# Patient Record
Sex: Female | Born: 1940 | Race: White | Hispanic: No | Marital: Married | Smoking: Former smoker
Health system: Southern US, Community
[De-identification: ages and names within clinical notes are randomized; demographics above are authoritative.]

## PROBLEM LIST (undated history)

## (undated) DIAGNOSIS — E119 Type 2 diabetes mellitus without complications: Secondary | ICD-10-CM

## (undated) DIAGNOSIS — G4730 Sleep apnea, unspecified: Secondary | ICD-10-CM

## (undated) DIAGNOSIS — K219 Gastro-esophageal reflux disease without esophagitis: Secondary | ICD-10-CM

## (undated) DIAGNOSIS — I10 Essential (primary) hypertension: Secondary | ICD-10-CM

## (undated) DIAGNOSIS — M1990 Unspecified osteoarthritis, unspecified site: Secondary | ICD-10-CM

## (undated) DIAGNOSIS — G709 Myoneural disorder, unspecified: Secondary | ICD-10-CM

## (undated) DIAGNOSIS — J449 Chronic obstructive pulmonary disease, unspecified: Secondary | ICD-10-CM

## (undated) DIAGNOSIS — F329 Major depressive disorder, single episode, unspecified: Secondary | ICD-10-CM

## (undated) DIAGNOSIS — F32A Depression, unspecified: Secondary | ICD-10-CM

## (undated) DIAGNOSIS — C801 Malignant (primary) neoplasm, unspecified: Secondary | ICD-10-CM

## (undated) DIAGNOSIS — J45909 Unspecified asthma, uncomplicated: Secondary | ICD-10-CM

## (undated) DIAGNOSIS — E039 Hypothyroidism, unspecified: Secondary | ICD-10-CM

## (undated) DIAGNOSIS — R0602 Shortness of breath: Secondary | ICD-10-CM

## (undated) HISTORY — PX: CHOLECYSTECTOMY: SHX55

## (undated) HISTORY — PX: COLON SURGERY: SHX602

## (undated) HISTORY — PX: TONSILLECTOMY: SUR1361

## (undated) HISTORY — PX: APPENDECTOMY: SHX54

## (undated) HISTORY — PX: BACK SURGERY: SHX140

## (undated) HISTORY — PX: CERVICAL SPINE SURGERY: SHX589

## (undated) HISTORY — PX: ABDOMINAL HYSTERECTOMY: SHX81

## (undated) HISTORY — PX: BREAST SURGERY: SHX581

## (undated) HISTORY — PX: MELANOMA EXCISION: SHX5266

## (undated) HISTORY — PX: THYROIDECTOMY: SHX17

---

## 2010-10-02 ENCOUNTER — Ambulatory Visit (HOSPITAL_COMMUNITY)
Admission: RE | Admit: 2010-10-02 | Discharge: 2010-10-02 | Payer: Self-pay | Source: Home / Self Care | Admitting: Internal Medicine

## 2010-10-12 ENCOUNTER — Ambulatory Visit (HOSPITAL_COMMUNITY)
Admission: RE | Admit: 2010-10-12 | Discharge: 2010-10-12 | Payer: Self-pay | Source: Home / Self Care | Attending: Internal Medicine | Admitting: Internal Medicine

## 2011-04-03 ENCOUNTER — Encounter (HOSPITAL_COMMUNITY)
Admission: RE | Admit: 2011-04-03 | Discharge: 2011-04-03 | Disposition: A | Discharge status: Home / Self Care | Payer: Medicare Other | Source: Ambulatory Visit | Attending: Neurosurgery | Admitting: Neurosurgery

## 2011-04-03 ENCOUNTER — Ambulatory Visit (HOSPITAL_COMMUNITY)
Admission: RE | Admit: 2011-04-03 | Discharge: 2011-04-03 | Disposition: A | Discharge status: Home / Self Care | Payer: Medicare Other | Source: Ambulatory Visit | Attending: Neurosurgery | Admitting: Neurosurgery

## 2011-04-03 ENCOUNTER — Other Ambulatory Visit (HOSPITAL_COMMUNITY): Payer: Self-pay | Admitting: Neurosurgery

## 2011-04-03 DIAGNOSIS — M4802 Spinal stenosis, cervical region: Secondary | ICD-10-CM

## 2011-04-03 LAB — BASIC METABOLIC PANEL
BUN: 21 mg/dL (ref 6–23)
Calcium: 9.3 mg/dL (ref 8.4–10.5)
Creatinine, Ser: 1.12 mg/dL (ref 0.4–1.2)
GFR calc Af Amer: 58 mL/min — ABNORMAL LOW (ref >60)
GFR calc non Af Amer: 48 mL/min — ABNORMAL LOW (ref >60)
Potassium: 4.8 mEq/L (ref 3.5–5.1)
Sodium: 142 mEq/L (ref 135–145)

## 2011-04-03 LAB — CBC
MCH: 30.2 pg (ref 26.0–34.0)
RBC: 4.2 MIL/uL (ref 3.87–5.11)

## 2011-04-03 LAB — ABO/RH: ABO/RH(D): O POS

## 2011-04-03 LAB — DIFFERENTIAL
Basophils Relative: 1 % (ref 0–1)
Eosinophils Absolute: 0.1 10*3/uL (ref 0.0–0.7)
Eosinophils Relative: 1 % (ref 0–5)
Lymphocytes Relative: 26 % (ref 12–46)
Monocytes Absolute: 0.6 10*3/uL (ref 0.1–1.0)
Neutro Abs: 4.4 10*3/uL (ref 1.7–7.7)
Neutrophils Relative %: 63 % (ref 43–77)

## 2011-04-03 LAB — TYPE AND SCREEN
ABO/RH(D): O POS
Antibody Screen: NEGATIVE

## 2011-04-03 LAB — SURGICAL PCR SCREEN
MRSA, PCR: NEGATIVE
Staphylococcus aureus: NEGATIVE

## 2011-04-04 ENCOUNTER — Ambulatory Visit (HOSPITAL_COMMUNITY): Payer: Medicare Other

## 2011-04-04 ENCOUNTER — Inpatient Hospital Stay (HOSPITAL_COMMUNITY)
Admission: RE | Admit: 2011-04-04 | Discharge: 2011-04-05 | DRG: 473 | Disposition: A | Discharge status: Home / Self Care | Payer: Medicare Other | Source: Ambulatory Visit | Attending: Neurosurgery | Admitting: Neurosurgery

## 2011-04-04 DIAGNOSIS — Z79899 Other long term (current) drug therapy: Secondary | ICD-10-CM

## 2011-04-04 DIAGNOSIS — E119 Type 2 diabetes mellitus without complications: Secondary | ICD-10-CM | POA: Diagnosis present

## 2011-04-04 DIAGNOSIS — J45909 Unspecified asthma, uncomplicated: Secondary | ICD-10-CM | POA: Diagnosis present

## 2011-04-04 DIAGNOSIS — F329 Major depressive disorder, single episode, unspecified: Secondary | ICD-10-CM | POA: Diagnosis present

## 2011-04-04 DIAGNOSIS — F3289 Other specified depressive episodes: Secondary | ICD-10-CM | POA: Diagnosis present

## 2011-04-04 DIAGNOSIS — Z7982 Long term (current) use of aspirin: Secondary | ICD-10-CM

## 2011-04-04 DIAGNOSIS — G4733 Obstructive sleep apnea (adult) (pediatric): Secondary | ICD-10-CM | POA: Diagnosis present

## 2011-04-04 DIAGNOSIS — I10 Essential (primary) hypertension: Secondary | ICD-10-CM | POA: Diagnosis present

## 2011-04-04 DIAGNOSIS — Z87891 Personal history of nicotine dependence: Secondary | ICD-10-CM

## 2011-04-04 DIAGNOSIS — K219 Gastro-esophageal reflux disease without esophagitis: Secondary | ICD-10-CM | POA: Diagnosis present

## 2011-04-04 DIAGNOSIS — M4712 Other spondylosis with myelopathy, cervical region: Principal | ICD-10-CM | POA: Diagnosis present

## 2011-04-04 DIAGNOSIS — Z794 Long term (current) use of insulin: Secondary | ICD-10-CM

## 2011-04-04 LAB — GLUCOSE, CAPILLARY
Glucose-Capillary: 161 mg/dL — ABNORMAL HIGH (ref 70–99)
Glucose-Capillary: 263 mg/dL — ABNORMAL HIGH (ref 70–99)

## 2011-04-05 LAB — GLUCOSE, CAPILLARY
Glucose-Capillary: 298 mg/dL — ABNORMAL HIGH (ref 70–99)
Glucose-Capillary: 261 mg/dL — ABNORMAL HIGH (ref 70–99)

## 2011-04-12 ENCOUNTER — Ambulatory Visit
Admission: RE | Admit: 2011-04-12 | Discharge: 2011-04-12 | Disposition: A | Discharge status: Home / Self Care | Payer: Medicare Other | Source: Ambulatory Visit | Attending: Neurosurgery | Admitting: Neurosurgery

## 2011-04-12 ENCOUNTER — Other Ambulatory Visit: Payer: Self-pay | Admitting: Neurosurgery

## 2011-04-12 DIAGNOSIS — M542 Cervicalgia: Secondary | ICD-10-CM

## 2011-04-27 NOTE — Op Note (Signed)
NAMEBERENIS, Lindsey West               ACCOUNT NO.:  [id number]  MEDICAL RECORD NO.:  [id number]  LOCATION:  3526                         FACILITY:  [facility]  PHYSICIAN:  Lindsey Maser. Deasia Chiu, M.D.    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  PROCEDURE NAME:  C5-6 and C6-7 anterior cervical diskectomy and fusion with allograft and plating.  SURGEON:  Lindsey Maser. Paelyn Smick, MD  ASSISTANT:  Donalee Citrin, MD  ANESTHESIA:  General endotracheal.  PREMEDICATION:  Lindsey West is a 70 year old female with history of neck pain and bilateral upper and lower extremity numbness, paresthesias, pain, and weakness consistent with cervical myelopathy.  Workup demonstrates evidence of marked cervical stenosis at C5-6 and C6-7.  The patient presents now for 2-level anterior cervical decompression and fusion in the hopes of improving her symptoms.  OPERATIVE NOTE:  The patient was brought to the operating room and placed on the operating table in supine position.  After adequate level of anesthesia was achieved, the patient was positioned supine with the neck slightly extended and held in place with halter traction.  The patient's anterior cervical region was prepped and draped sterilely.  A #10 blade was used to make a curvilinear skin incision overlying the C6 vertebral level.  This was carried down sharply to the platysma.  The platysma was divided vertically and dissection proceeded along the medial border of the sternocleidomastoid muscle and carotid sheath. Trachea and esophagus were mobilized and tracked towards the left. Prevertebral fascia was stripped off the anterior spinal column.  Longus colli was then elevated bilaterally using electrocautery.  Deep self- retaining retractor was placed.   Intraoperative fluoroscopy was used and the levels were confirmed.  Disk spaces of C5-6 and C6-7 were incised with a #15 blade in a rectangular fashion.  A wide disk space clean out was then achieved using pituitary rongeurs, forward and backward Karlin curettes, Kerrison rongeurs, and high-speed drill.  All elements of disks were removed down to the level of the posterior annulus. Microscope was then brought to the field and used throughout the remainder of the diskectomy and remaining aspects of the annulus. Osteophytes were removed using high-speed drill down to the level of the posterior longitudinal ligament.  The posterior longitudinal ligament was elevated and resected in a piecemeal fashion using Kerrison rongeurs.  Underlying thecal sac was then identified.  Wide central decompression was then performed undercutting the bodies of C5-C6.  Decompression was then proceeded out each neural foramen.  Wide anterior foraminotomies were then performed along the course of the exiting C6 nerve roots bilaterally.  At this point, a very thorough decompression was achieved. There was no evidence of injury to thecal sac or nerve roots.  Procedure was then repeated at C6-7 again without complication.  Wound was then irrigated with antibiotic solution.  Gelfoam was placed topically for hemostasis as needed.  Cornerstone allograft wedge was then packed into place and recessed approximately 1 mm for the anterior margin at C5-6 and  C6-7.  A 42-mm Atlantis anterior cervical plate was then placed over the C5, C6, and C7 levels.  This was then attached under fluoroscopic guidance using 13-mm variable angle screws, two each at all three levels.  All six screws were given final tightening and found to be solidly within bone.  Locking screws were engaged at all three levels. Final images revealed good position of the bone graft, hardware at proper operative level, and normal alignment of spine.  Wound  was then irrigated with antibiotic solution.  Hemostasis was ensured with bipolar electrocautery.  Wound was then closed in layers.  Steri-Strips and sterile dressing were applied.  There were no complications.  The patient tolerated the procedure well and she returned to the recovery room postoperatively.          ______________________________ Lindsey West, M.D.     HAP/MEDQ  D:  04/04/2011  T:  04/05/2011  Job:  [id number]  Electronically Signed by Julio Sicks M.D. on 04/27/2011 08:00:47 AM

## 2011-05-14 ENCOUNTER — Other Ambulatory Visit: Payer: Self-pay | Admitting: Neurosurgery

## 2011-05-14 DIAGNOSIS — M5124 Other intervertebral disc displacement, thoracic region: Secondary | ICD-10-CM

## 2011-05-17 ENCOUNTER — Ambulatory Visit
Admission: RE | Admit: 2011-05-17 | Discharge: 2011-05-17 | Disposition: A | Discharge status: Home / Self Care | Payer: Medicare Other | Source: Ambulatory Visit | Attending: Neurosurgery | Admitting: Neurosurgery

## 2011-05-17 DIAGNOSIS — M5124 Other intervertebral disc displacement, thoracic region: Secondary | ICD-10-CM

## 2011-05-17 DIAGNOSIS — M549 Dorsalgia, unspecified: Secondary | ICD-10-CM

## 2011-05-17 MED ORDER — IOHEXOL 300 MG/ML  SOLN
10.0000 mL | Freq: Once | INTRAMUSCULAR | Status: AC | PRN
  Administered 2011-05-17: 10 mL via INTRATHECAL

## 2011-05-17 MED ORDER — DIAZEPAM 2 MG PO TABS: 5.0000 mg | ORAL_TABLET | Freq: Once | ORAL | Status: DC

## 2011-05-17 NOTE — Progress Notes (Signed)
Pt returned from myelo doing well, denies discomfort at present.

## 2011-06-13 ENCOUNTER — Encounter (HOSPITAL_COMMUNITY)
Admission: RE | Admit: 2011-06-13 | Discharge: 2011-06-13 | Disposition: A | Discharge status: Home / Self Care | Payer: Medicare Other | Source: Ambulatory Visit | Attending: Neurosurgery | Admitting: Neurosurgery

## 2011-06-13 DIAGNOSIS — Z01812 Encounter for preprocedural laboratory examination: Secondary | ICD-10-CM | POA: Insufficient documentation

## 2011-06-13 DIAGNOSIS — Z01818 Encounter for other preprocedural examination: Secondary | ICD-10-CM | POA: Insufficient documentation

## 2011-06-13 LAB — CBC
MCV: 90.5 fL (ref 78.0–100.0)
Platelets: 175 10*3/uL (ref 150–400)
RBC: 4.21 MIL/uL (ref 3.87–5.11)
WBC: 5.5 10*3/uL (ref 4.0–10.5)

## 2011-06-13 LAB — BASIC METABOLIC PANEL
CO2: 31 mEq/L (ref 19–32)
Calcium: 9.4 mg/dL (ref 8.4–10.5)
GFR calc non Af Amer: 48 mL/min — ABNORMAL LOW (ref >60)
Sodium: 141 mEq/L (ref 135–145)

## 2011-06-13 LAB — DIFFERENTIAL
Eosinophils Absolute: 0.1 10*3/uL (ref 0.0–0.7)
Lymphs Abs: 1.9 10*3/uL (ref 0.7–4.0)
Neutro Abs: 3 10*3/uL (ref 1.7–7.7)
Neutrophils Relative %: 54 % (ref 43–77)

## 2011-06-13 LAB — TYPE AND SCREEN: ABO/RH(D): O POS

## 2011-06-13 LAB — SURGICAL PCR SCREEN
MRSA, PCR: NEGATIVE
Staphylococcus aureus: NEGATIVE

## 2011-06-26 ENCOUNTER — Inpatient Hospital Stay (HOSPITAL_COMMUNITY): Admission: RE | Admit: 2011-06-26 | Payer: Medicare Other | Source: Ambulatory Visit | Admitting: Neurosurgery

## 2011-09-15 IMAGING — RF DG MYELOGRAM THORACIC
10 series · 10 of 10 positions shown · non-contrast
Comparison: none

CLINICAL DATA: Prior cervical fusion. Thoracic spondylosis.  Mid
back pain.
TECHNIQUE: Contiguous axial images were obtained through the
Cervical spine without infusion. Coronal and sagittal
reconstructions were obtained of the axial image sets.

[Series 1: (hospital) · 1 of 1 slices shown (1 of 2)]
[im 1/1]
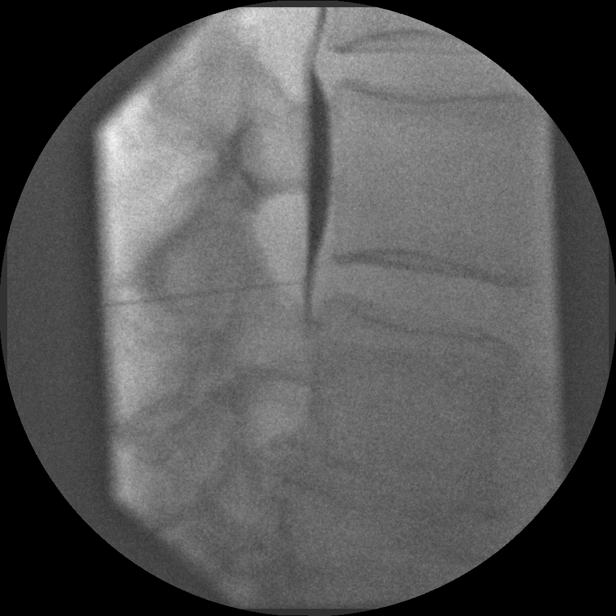

[Series 2: (hospital) · 1 of 1 slices shown (2 of 2)]
[im 1/1]
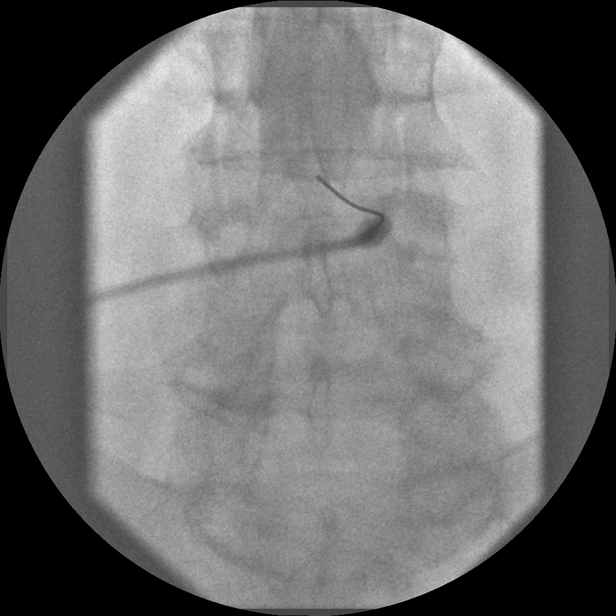

[Series 3: myelogram  white · 1 of 1 slices shown (1 of 8)]
[im 1/1]
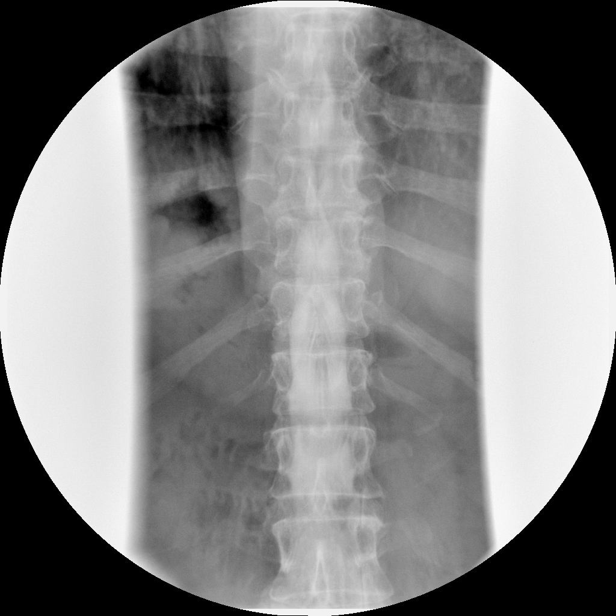

[Series 4: myelogram  white · 1 of 1 slices shown (2 of 8)]
[im 1/1]
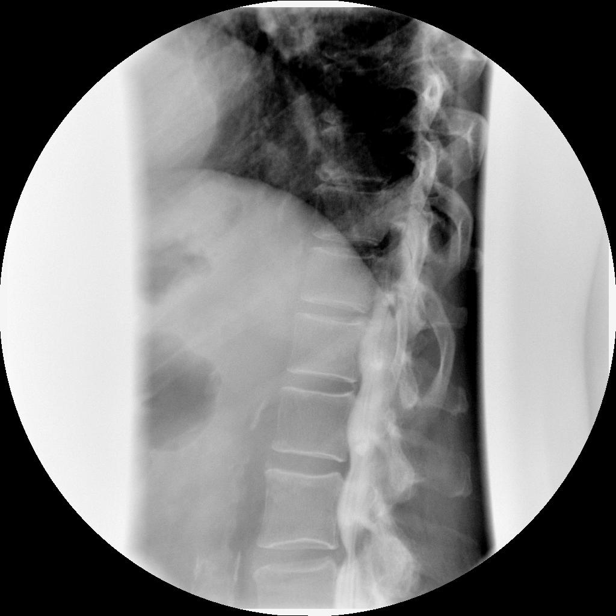

[Series 5: myelogram  white · 1 of 1 slices shown (3 of 8)]
[im 1/1]
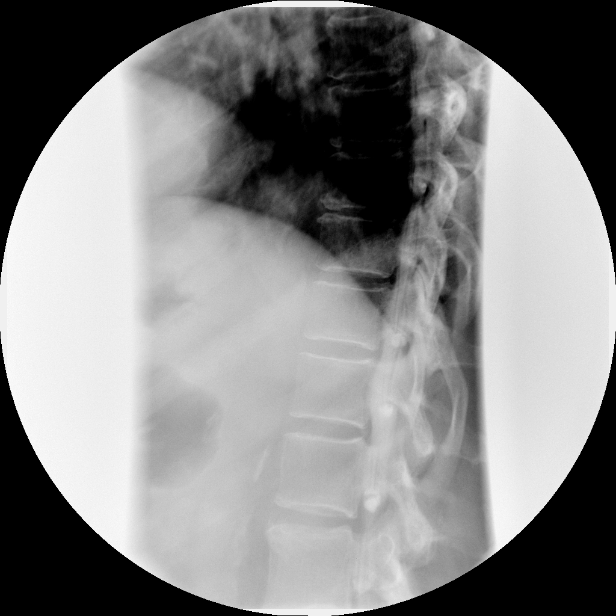

[Series 6: myelogram  white · 1 of 1 slices shown (4 of 8)]
[im 1/1]
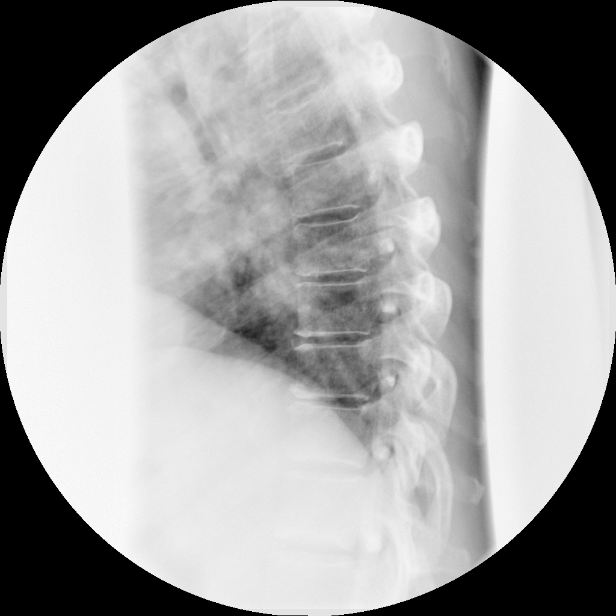

[Series 7: myelogram  white · 1 of 1 slices shown (5 of 8)]
[im 1/1]
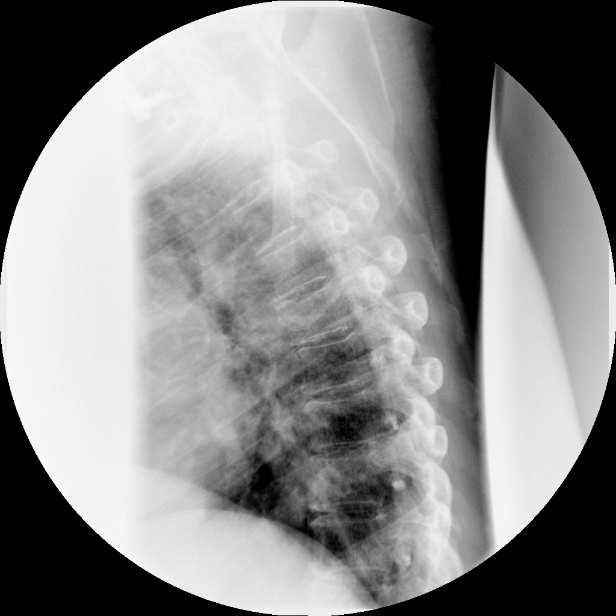

[Series 8: myelogram  white · 1 of 1 slices shown (6 of 8)]
[im 1/1]
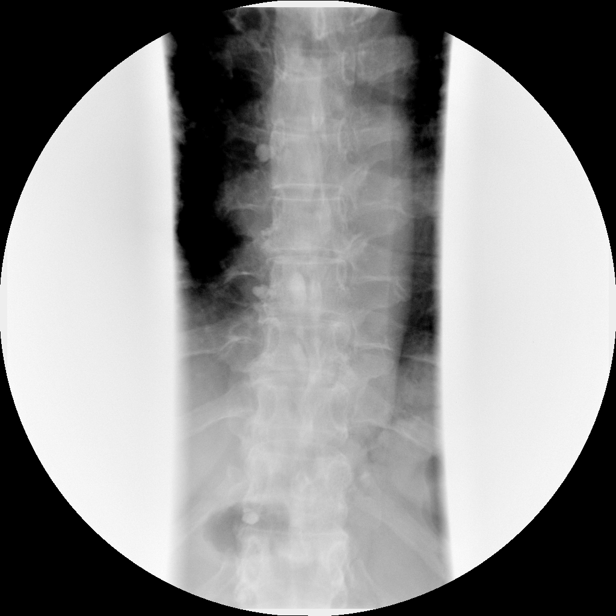

[Series 9: myelogram  white · 1 of 1 slices shown (7 of 8)]
[im 1/1]
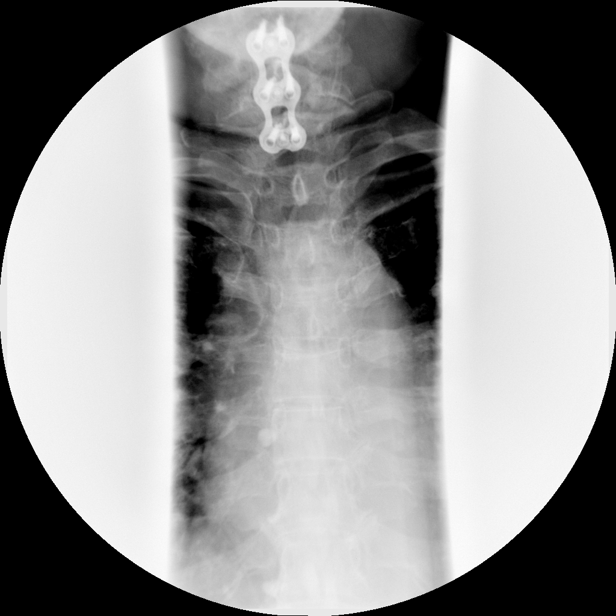

[Series 10: myelogram  white · 1 of 1 slices shown (8 of 8)]
[im 1/1]
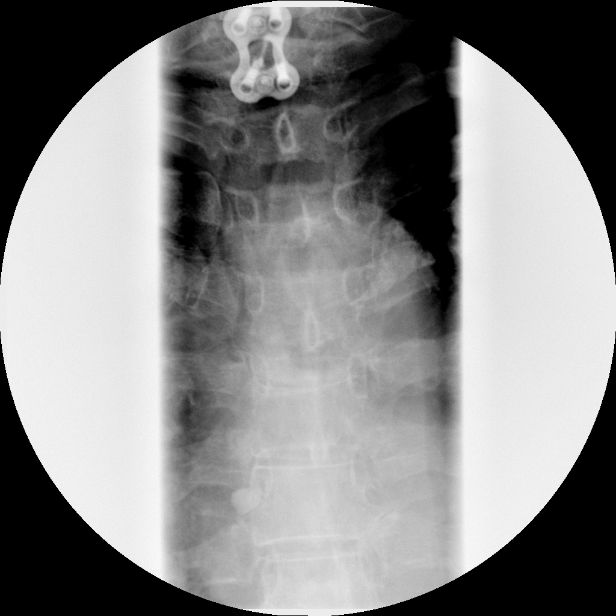

[10 of 10 positions shown; findings below may reference images not displayed]

LUMBAR PUNCTURE FOR THORACIC MYELOGRAM

 Procedure: After thorough discussion of risks and benefits of the
procedure including bleeding, infection, injury to nerves, blood
vessels, adjacent structures as well as headache and CSF leak,
written and oral informed consent was obtained.   Consent was
obtained by Dr.Christian Damian.

Patient was positioned prone on the fluoroscopy table. Local
anesthesia was provided with 1% lidocaine without epinephrine after
prepped and draped in the usual sterile fashion. Puncture was
performed at L3-L4 using a five inch 22-gauge spinal needle via
right paramedian approach.  Using a single pass through the dura,
the needle was placed within the thecal sac, with return of clear
CSF. 8 mL of Omnipaque-O77 was injected into the thecal sac, with
normal opacification of the nerve roots and cauda equina consistent
with free flow within the subarachnoid space.

Fluoroscopy time: 1 minute 31 seconds
FINDINGS: Spinal cord appears to terminate about T12.  Suboptimal
visualization of the thoracic spine on plain film imaging.  Lower
cervical ACDF visualized.
IMPRESSION: 1.  Technically successful lumbar puncture for thoracic myelogram.
2.  Suboptimal visualization of the thoracic spine by plain film.
See below.

CT THORACIC MYELOGRAM
FINDINGS: Incidental visualization of the paraspinal soft tissues
shows enlargement of the pulmonary arteries, with pruning of the
segmental pulmonary arteries, compatible with pulmonary arterial
hypertension. Aortic atherosclerosis.  Small area of nodular
density is present in the medial right lower lobe, likely
representing nodular atelectasis.  This is present on image 75 of
series 2, measuring 7 mm.  Coronary artery disease.  Mild mitral
and aortic annular calcification.  Mild thickening of the thoracic
esophagus can be associated with reflux.  Small hiatal hernia.  C6-
C7 ACDF is partially visualized. Age age indeterminant compression
fracture of T5 involving the superior endplate with about 25% loss
of anterior vertebral body height.  No paravertebral phlegmon to
suggest acute time course.

C7-T1:  Negative.

T1-T2: Mild bilateral facet spurring.  No stenosis.
T2-T3:  Negative.
T3-T4:  Negative.
T4-T5:  Negative.
T5-T6:  Negative.
T6-T7:  Negative aside from perineural cyst in the right foramen.
T7-T8: Moderate sized disc extrusion is present, extending from the
right paracentral to left posterolateral region.  This contacts and
indents the left ventral aspect of the thoracic cord, deflecting it
posteriorly slightly.  The foramina are patent.  Between six and 7
mm of cranial and caudal extension of disc material.
T8-T9:  Shallow central disc protrusion is present which may just
contact the ventral aspect of the thoracic cord.  No definite cord
deformity.
T9-T10:  Small area of vacuum disc anteriorly.  Calcification of
the ligamentum flavum.  No stenosis.
T10-T11:  Calcification of the ligamentum flavum.  Mild narrowing
of the posterior subarachnoid space without cord deformity.
T11-T12:  Right perineural cyst in the foramen.  No stenosis.
T12-L1:  Shallow broad-based partially calcified disc bulge.
Foramina patent.
IMPRESSION: 1.  Enlargement of the pulmonary arteries suggesting pulmonary
arterial hypertension
2.  T5 superior endplate compression fracture with less than 25%
loss of anterior vertebral body height.
3.  T7-T8 moderate left paracentral disc extrusion with cranial and
caudal extension.  Mild central stenosis with AP diameter of the
thecal sac 10 mm.  Extrusion contacts and flattens the left ventral
aspect of the thoracic cord.
4.  T8-T9 shallow central disc protrusion which may just contact
the ventral cord.  No cord deformity.
5.  7 mm medial right lower lobe pulmonary nodule may represent
nodular atelectasis however neoplasm is not excluded.  Follow-up 6-
month noncontrast chest CT recommended to reassess.

## 2011-11-07 ENCOUNTER — Other Ambulatory Visit (HOSPITAL_COMMUNITY): Payer: Self-pay | Admitting: Internal Medicine

## 2011-11-07 DIAGNOSIS — C73 Malignant neoplasm of thyroid gland: Secondary | ICD-10-CM

## 2011-11-19 ENCOUNTER — Inpatient Hospital Stay (HOSPITAL_COMMUNITY)
Admission: RE | Admit: 2011-11-19 | Discharge: 2011-11-19 | Payer: Medicare Other | Source: Ambulatory Visit | Attending: Internal Medicine | Admitting: Internal Medicine

## 2011-12-17 ENCOUNTER — Encounter (HOSPITAL_COMMUNITY)
Admission: RE | Admit: 2011-12-17 | Discharge: 2011-12-17 | Disposition: A | Discharge status: Home / Self Care | Payer: Medicare Other | Source: Ambulatory Visit | Attending: Internal Medicine | Admitting: Internal Medicine

## 2011-12-17 DIAGNOSIS — C73 Malignant neoplasm of thyroid gland: Secondary | ICD-10-CM | POA: Insufficient documentation

## 2011-12-17 MED ORDER — THYROTROPIN ALFA 1.1 MG IM SOLR
0.9000 mg | INTRAMUSCULAR | Status: DC
  Filled 2011-12-17: qty 0.9

## 2011-12-18 ENCOUNTER — Ambulatory Visit (HOSPITAL_COMMUNITY)
Admission: RE | Admit: 2011-12-18 | Discharge: 2011-12-18 | Disposition: A | Discharge status: Home / Self Care | Payer: Medicare Other | Source: Ambulatory Visit | Attending: Internal Medicine | Admitting: Internal Medicine

## 2011-12-18 MED ORDER — THYROTROPIN ALFA 1.1 MG IM SOLR
0.9000 mg | INTRAMUSCULAR | Status: DC
  Filled 2011-12-18: qty 0.9

## 2011-12-19 ENCOUNTER — Encounter (HOSPITAL_COMMUNITY)
Admission: RE | Admit: 2011-12-19 | Discharge: 2011-12-19 | Disposition: A | Discharge status: Home / Self Care | Payer: Medicare Other | Source: Ambulatory Visit | Attending: Internal Medicine | Admitting: Internal Medicine

## 2011-12-21 ENCOUNTER — Encounter (HOSPITAL_COMMUNITY)
Admission: RE | Admit: 2011-12-21 | Discharge: 2011-12-21 | Disposition: A | Discharge status: Home / Self Care | Payer: Medicare Other | Source: Ambulatory Visit | Attending: Internal Medicine | Admitting: Internal Medicine

## 2011-12-22 MED ORDER — SODIUM IODIDE I 131 CAPSULE
4.0000 | Freq: Once | INTRAVENOUS | Status: AC | PRN
  Administered 2011-12-22: 4 via ORAL

## 2012-07-30 ENCOUNTER — Other Ambulatory Visit: Payer: Self-pay | Admitting: Neurosurgery

## 2012-08-01 ENCOUNTER — Encounter (HOSPITAL_COMMUNITY): Payer: Self-pay | Admitting: Pharmacy Technician

## 2012-08-05 NOTE — Pre-Procedure Instructions (Signed)
18 Lindsey West  08/05/2012   Your procedure is scheduled on:  Monday August 11, 2012  Report to [facility] at 8:30 AM.  Call this number if you have problems the morning of surgery: [phone]   Remember:   Do not eat food or drink After Midnight.      Take these medicines the morning of surgery with A SIP OF WATER: celexa, advair, claritin, metoprolol, singulair, omeprazole,    Do not wear jewelry, make-up or nail polish.  Do not wear lotions, powders, or perfumes. You may wear deodorant.  Do not shave 48 hours prior to surgery. Men may shave face and neck.  Do not bring valuables to the hospital.  Contacts, dentures or bridgework may not be worn into surgery.  Leave suitcase in the car. After surgery it may be brought to your room.  For patients admitted to the hospital, checkout time is 11:00 AM the day of discharge.   Patients discharged the day of surgery will not be allowed to drive home.  Name and phone number of your driver: family / friend  Special Instructions: Shower using CHG 2 nights before surgery and the night before surgery.  If you shower the day of surgery use CHG.  Use special wash - you have one bottle of CHG for all showers.  You should use approximately 1/3 of the bottle for each shower.   Please read over the following fact sheets that you were given: Pain Booklet, Coughing and Deep Breathing, Blood Transfusion Information, MRSA Information and Surgical Site Infection Prevention

## 2012-08-06 ENCOUNTER — Encounter (HOSPITAL_COMMUNITY)
Admission: RE | Admit: 2012-08-06 | Discharge: 2012-08-06 | Disposition: A | Discharge status: Home / Self Care | Payer: Medicare Other | Source: Ambulatory Visit | Attending: Neurosurgery | Admitting: Neurosurgery

## 2012-08-06 ENCOUNTER — Encounter (HOSPITAL_COMMUNITY): Payer: Self-pay

## 2012-08-06 HISTORY — DX: Malignant (primary) neoplasm, unspecified: C80.1

## 2012-08-06 HISTORY — DX: Type 2 diabetes mellitus without complications: E11.9

## 2012-08-06 HISTORY — DX: Unspecified asthma, uncomplicated: J45.909

## 2012-08-06 HISTORY — DX: Shortness of breath: R06.02

## 2012-08-06 HISTORY — DX: Chronic obstructive pulmonary disease, unspecified: J44.9

## 2012-08-06 HISTORY — DX: Unspecified osteoarthritis, unspecified site: M19.90

## 2012-08-06 HISTORY — DX: Major depressive disorder, single episode, unspecified: F32.9

## 2012-08-06 HISTORY — DX: Hypothyroidism, unspecified: E03.9

## 2012-08-06 HISTORY — DX: Essential (primary) hypertension: I10

## 2012-08-06 HISTORY — DX: Sleep apnea, unspecified: G47.30

## 2012-08-06 HISTORY — DX: Depression, unspecified: F32.A

## 2012-08-06 HISTORY — DX: Gastro-esophageal reflux disease without esophagitis: K21.9

## 2012-08-06 LAB — CBC WITH DIFFERENTIAL/PLATELET
Basophils Relative: 0 % (ref 0–1)
Eosinophils Absolute: 0.1 10*3/uL (ref 0.0–0.7)
Eosinophils Relative: 1 % (ref 0–5)
Hemoglobin: 12.2 g/dL (ref 12.0–15.0)
MCH: 30.5 pg (ref 26.0–34.0)
MCHC: 32.4 g/dL (ref 30.0–36.0)
MCV: 94 fL (ref 78.0–100.0)
Monocytes Relative: 7 % (ref 3–12)
Neutrophils Relative %: 66 % (ref 43–77)
Platelets: 208 10*3/uL (ref 150–400)

## 2012-08-06 LAB — BASIC METABOLIC PANEL
BUN: 16 mg/dL (ref 6–23)
Calcium: 9 mg/dL (ref 8.4–10.5)
GFR calc non Af Amer: 52 mL/min — ABNORMAL LOW (ref >90)
Glucose, Bld: 80 mg/dL (ref 70–99)

## 2012-08-06 LAB — TYPE AND SCREEN: Antibody Screen: NEGATIVE

## 2012-08-06 NOTE — Progress Notes (Addendum)
Requested cardiac records from Dr. Reola Calkins, [facility].   Requested sleep study from [facility] - Dr. Ladona Ridgel. Pt starts the night with CPAP on, but takes the mask off after a couple hours - wakes up "suffocating".   @ 1317 Sleep study not on record at [facility].

## 2012-08-08 NOTE — Progress Notes (Signed)
PATIENT INSTRUCTED  TO ARRIVE AT 530 AM 08/11/12 FOR 730 SURGERY .

## 2012-08-10 MED ORDER — CEFAZOLIN SODIUM-DEXTROSE 2-3 GM-% IV SOLR
2.0000 g | INTRAVENOUS | Status: AC
  Administered 2012-08-11: 2 g via INTRAVENOUS
  Filled 2012-08-10: qty 50

## 2012-08-11 ENCOUNTER — Encounter (HOSPITAL_COMMUNITY): Payer: Self-pay | Admitting: Anesthesiology

## 2012-08-11 ENCOUNTER — Ambulatory Visit (HOSPITAL_COMMUNITY): Payer: Medicare Other

## 2012-08-11 ENCOUNTER — Encounter (HOSPITAL_COMMUNITY): Payer: Self-pay | Admitting: Neurosurgery

## 2012-08-11 ENCOUNTER — Encounter (HOSPITAL_COMMUNITY): Payer: Self-pay | Admitting: *Deleted

## 2012-08-11 ENCOUNTER — Ambulatory Visit (HOSPITAL_COMMUNITY): Payer: Medicare Other | Admitting: Anesthesiology

## 2012-08-11 ENCOUNTER — Ambulatory Visit (HOSPITAL_COMMUNITY)
Admission: RE | Admit: 2012-08-11 | Discharge: 2012-08-12 | Disposition: A | Discharge status: Home / Self Care | Payer: Medicare Other | Source: Ambulatory Visit | Attending: Neurosurgery | Admitting: Neurosurgery

## 2012-08-11 ENCOUNTER — Encounter (HOSPITAL_COMMUNITY)
Admission: RE | Disposition: A | Discharge status: Home / Self Care | Payer: Self-pay | Source: Ambulatory Visit | Attending: Neurosurgery

## 2012-08-11 DIAGNOSIS — E119 Type 2 diabetes mellitus without complications: Secondary | ICD-10-CM | POA: Insufficient documentation

## 2012-08-11 DIAGNOSIS — J449 Chronic obstructive pulmonary disease, unspecified: Secondary | ICD-10-CM | POA: Insufficient documentation

## 2012-08-11 DIAGNOSIS — Z794 Long term (current) use of insulin: Secondary | ICD-10-CM | POA: Insufficient documentation

## 2012-08-11 DIAGNOSIS — J4489 Other specified chronic obstructive pulmonary disease: Secondary | ICD-10-CM | POA: Insufficient documentation

## 2012-08-11 DIAGNOSIS — M5104 Intervertebral disc disorders with myelopathy, thoracic region: Secondary | ICD-10-CM | POA: Insufficient documentation

## 2012-08-11 DIAGNOSIS — Z01812 Encounter for preprocedural laboratory examination: Secondary | ICD-10-CM | POA: Insufficient documentation

## 2012-08-11 DIAGNOSIS — Z9181 History of falling: Secondary | ICD-10-CM | POA: Insufficient documentation

## 2012-08-11 DIAGNOSIS — M5124 Other intervertebral disc displacement, thoracic region: Secondary | ICD-10-CM | POA: Diagnosis present

## 2012-08-11 DIAGNOSIS — I10 Essential (primary) hypertension: Secondary | ICD-10-CM | POA: Insufficient documentation

## 2012-08-11 LAB — GLUCOSE, CAPILLARY: Glucose-Capillary: 146 mg/dL — ABNORMAL HIGH (ref 70–99)

## 2012-08-11 SURGERY — THORACIC DISCECTOMY
Anesthesia: General | Site: Back | Laterality: Left | Wound class: Clean

## 2012-08-11 MED ORDER — BUPIVACAINE HCL (PF) 0.25 % IJ SOLN
INTRAMUSCULAR | Status: DC | PRN
  Administered 2012-08-11: 20 mL

## 2012-08-11 MED ORDER — ALUM & MAG HYDROXIDE-SIMETH 200-200-20 MG/5ML PO SUSP: 30.0000 mL | Freq: Four times a day (QID) | ORAL | Status: DC | PRN

## 2012-08-11 MED ORDER — HYDROMORPHONE HCL PF 1 MG/ML IJ SOLN
INTRAMUSCULAR | Status: AC
  Filled 2012-08-11: qty 1

## 2012-08-11 MED ORDER — 0.9 % SODIUM CHLORIDE (POUR BTL) OPTIME
TOPICAL | Status: DC | PRN
  Administered 2012-08-11: 1000 mL

## 2012-08-11 MED ORDER — OXYCODONE-ACETAMINOPHEN 5-325 MG PO TABS: 1.0000 | ORAL_TABLET | ORAL | Status: DC | PRN

## 2012-08-11 MED ORDER — BACITRACIN 50000 UNITS IM SOLR
INTRAMUSCULAR | Status: AC
  Filled 2012-08-11: qty 1

## 2012-08-11 MED ORDER — KETOROLAC TROMETHAMINE 30 MG/ML IJ SOLN
30.0000 mg | Freq: Four times a day (QID) | INTRAMUSCULAR | Status: DC
  Administered 2012-08-11 – 2012-08-12 (×3): 30 mg via INTRAVENOUS
  Filled 2012-08-11 (×7): qty 1

## 2012-08-11 MED ORDER — SODIUM CHLORIDE 0.9 % IJ SOLN
3.0000 mL | Freq: Two times a day (BID) | INTRAMUSCULAR | Status: DC
  Administered 2012-08-11 (×2): 3 mL via INTRAVENOUS

## 2012-08-11 MED ORDER — FLUTICASONE-SALMETEROL 250-50 MCG/DOSE IN AEPB
2.0000 | INHALATION_SPRAY | Freq: Every day | RESPIRATORY_TRACT | Status: DC
  Filled 2012-08-11: qty 14

## 2012-08-11 MED ORDER — ARTIFICIAL TEARS OP OINT
TOPICAL_OINTMENT | OPHTHALMIC | Status: DC | PRN
  Administered 2012-08-11: 1 via OPHTHALMIC

## 2012-08-11 MED ORDER — METFORMIN HCL 500 MG PO TABS
1000.0000 mg | ORAL_TABLET | Freq: Every day | ORAL | Status: DC
  Administered 2012-08-12: 1000 mg via ORAL
  Filled 2012-08-11 (×2): qty 2

## 2012-08-11 MED ORDER — INSULIN REGULAR HUMAN 100 UNIT/ML IJ SOLN: 30.0000 [IU] | Freq: Two times a day (BID) | INTRAMUSCULAR | Status: DC

## 2012-08-11 MED ORDER — BISACODYL 10 MG RE SUPP: 10.0000 mg | Freq: Every day | RECTAL | Status: DC | PRN

## 2012-08-11 MED ORDER — ASPIRIN EC 81 MG PO TBEC
81.0000 mg | DELAYED_RELEASE_TABLET | Freq: Every day | ORAL | Status: DC
  Administered 2012-08-11: 81 mg via ORAL
  Filled 2012-08-11 (×2): qty 1

## 2012-08-11 MED ORDER — MONTELUKAST SODIUM 10 MG PO TABS
10.0000 mg | ORAL_TABLET | Freq: Every morning | ORAL | Status: DC
  Filled 2012-08-11: qty 1

## 2012-08-11 MED ORDER — FENTANYL CITRATE 0.05 MG/ML IJ SOLN
INTRAMUSCULAR | Status: DC | PRN
  Administered 2012-08-11: 150 ug via INTRAVENOUS

## 2012-08-11 MED ORDER — SODIUM CHLORIDE 0.9 % IJ SOLN: 3.0000 mL | INTRAMUSCULAR | Status: DC | PRN

## 2012-08-11 MED ORDER — METOPROLOL SUCCINATE ER 100 MG PO TB24
100.0000 mg | ORAL_TABLET | ORAL | Status: DC
  Administered 2012-08-11: 100 mg via ORAL
  Filled 2012-08-11 (×2): qty 1

## 2012-08-11 MED ORDER — LEVOTHYROXINE SODIUM 150 MCG PO TABS
150.0000 ug | ORAL_TABLET | Freq: Every day | ORAL | Status: DC
  Administered 2012-08-12: 150 ug via ORAL
  Filled 2012-08-11 (×2): qty 1

## 2012-08-11 MED ORDER — ONDANSETRON HCL 4 MG/2ML IJ SOLN
INTRAMUSCULAR | Status: DC | PRN
  Administered 2012-08-11: 4 mg via INTRAVENOUS

## 2012-08-11 MED ORDER — HYDROMORPHONE HCL PF 1 MG/ML IJ SOLN
0.2500 mg | INTRAMUSCULAR | Status: DC | PRN
  Administered 2012-08-11: 0.5 mg via INTRAVENOUS

## 2012-08-11 MED ORDER — PHENOL 1.4 % MT LIQD: 1.0000 | OROMUCOSAL | Status: DC | PRN

## 2012-08-11 MED ORDER — ONDANSETRON HCL 4 MG/2ML IJ SOLN: 4.0000 mg | Freq: Once | INTRAMUSCULAR | Status: DC | PRN

## 2012-08-11 MED ORDER — INSULIN ASPART 100 UNIT/ML ~~LOC~~ SOLN
0.0000 [IU] | Freq: Every day | SUBCUTANEOUS | Status: DC
  Administered 2012-08-11: 4 [IU] via SUBCUTANEOUS

## 2012-08-11 MED ORDER — NEOSTIGMINE METHYLSULFATE 1 MG/ML IJ SOLN
INTRAMUSCULAR | Status: DC | PRN
  Administered 2012-08-11: 3 mg via INTRAVENOUS

## 2012-08-11 MED ORDER — FLEET ENEMA 7-19 GM/118ML RE ENEM
1.0000 | ENEMA | Freq: Once | RECTAL | Status: AC | PRN
  Filled 2012-08-11: qty 1

## 2012-08-11 MED ORDER — HYDROMORPHONE HCL PF 1 MG/ML IJ SOLN
0.5000 mg | INTRAMUSCULAR | Status: DC | PRN
  Administered 2012-08-11 – 2012-08-12 (×3): 1 mg via INTRAVENOUS
  Filled 2012-08-11 (×3): qty 1

## 2012-08-11 MED ORDER — PHENYLEPHRINE HCL 10 MG/ML IJ SOLN
INTRAMUSCULAR | Status: DC | PRN
  Administered 2012-08-11 (×5): 80 ug via INTRAVENOUS

## 2012-08-11 MED ORDER — SODIUM CHLORIDE 0.9 % IV SOLN: 250.0000 mL | INTRAVENOUS | Status: DC

## 2012-08-11 MED ORDER — PROPOFOL 10 MG/ML IV BOLUS
INTRAVENOUS | Status: DC | PRN
  Administered 2012-08-11: 110 mg via INTRAVENOUS

## 2012-08-11 MED ORDER — LORATADINE 10 MG PO TABS
10.0000 mg | ORAL_TABLET | Freq: Every day | ORAL | Status: DC | PRN
  Filled 2012-08-11: qty 1

## 2012-08-11 MED ORDER — INSULIN ASPART 100 UNIT/ML ~~LOC~~ SOLN
0.0000 [IU] | Freq: Three times a day (TID) | SUBCUTANEOUS | Status: DC
  Administered 2012-08-11: 15 [IU] via SUBCUTANEOUS
  Administered 2012-08-12: 7 [IU] via SUBCUTANEOUS

## 2012-08-11 MED ORDER — ACETAMINOPHEN 325 MG PO TABS: 650.0000 mg | ORAL_TABLET | ORAL | Status: DC | PRN

## 2012-08-11 MED ORDER — PANTOPRAZOLE SODIUM 40 MG PO TBEC: 40.0000 mg | DELAYED_RELEASE_TABLET | Freq: Every day | ORAL | Status: DC

## 2012-08-11 MED ORDER — LIDOCAINE HCL (CARDIAC) 20 MG/ML IV SOLN
INTRAVENOUS | Status: DC | PRN
  Administered 2012-08-11: 80 mg via INTRAVENOUS

## 2012-08-11 MED ORDER — OXYCODONE HCL 5 MG PO TABS: 5.0000 mg | ORAL_TABLET | Freq: Once | ORAL | Status: DC | PRN

## 2012-08-11 MED ORDER — LACTATED RINGERS IV SOLN
INTRAVENOUS | Status: DC | PRN
  Administered 2012-08-11 (×2): via INTRAVENOUS

## 2012-08-11 MED ORDER — AMITRIPTYLINE HCL 100 MG PO TABS
100.0000 mg | ORAL_TABLET | Freq: Every day | ORAL | Status: DC
  Administered 2012-08-11: 100 mg via ORAL
  Filled 2012-08-11 (×2): qty 1

## 2012-08-11 MED ORDER — KETOROLAC TROMETHAMINE 30 MG/ML IJ SOLN
INTRAMUSCULAR | Status: DC | PRN
  Administered 2012-08-11: 30 mg via INTRAVENOUS

## 2012-08-11 MED ORDER — SODIUM CHLORIDE 0.9 % IR SOLN
Status: DC | PRN
  Administered 2012-08-11: 09:00:00

## 2012-08-11 MED ORDER — VITAMIN D (ERGOCALCIFEROL) 1.25 MG (50000 UNIT) PO CAPS: 50000.0000 [IU] | ORAL_CAPSULE | ORAL | Status: DC

## 2012-08-11 MED ORDER — ROCURONIUM BROMIDE 100 MG/10ML IV SOLN
INTRAVENOUS | Status: DC | PRN
  Administered 2012-08-11: 50 mg via INTRAVENOUS

## 2012-08-11 MED ORDER — CEFAZOLIN SODIUM 1-5 GM-% IV SOLN
1.0000 g | Freq: Three times a day (TID) | INTRAVENOUS | Status: AC
  Administered 2012-08-11 – 2012-08-12 (×2): 1 g via INTRAVENOUS
  Filled 2012-08-11 (×2): qty 50

## 2012-08-11 MED ORDER — MIDAZOLAM HCL 5 MG/5ML IJ SOLN
INTRAMUSCULAR | Status: DC | PRN
  Administered 2012-08-11: 2 mg via INTRAVENOUS

## 2012-08-11 MED ORDER — ZOLPIDEM TARTRATE 5 MG PO TABS: 5.0000 mg | ORAL_TABLET | Freq: Every evening | ORAL | Status: DC | PRN

## 2012-08-11 MED ORDER — ONDANSETRON HCL 4 MG/2ML IJ SOLN: 4.0000 mg | INTRAMUSCULAR | Status: DC | PRN

## 2012-08-11 MED ORDER — POLYETHYLENE GLYCOL 3350 17 G PO PACK
17.0000 g | PACK | Freq: Every day | ORAL | Status: DC | PRN
  Filled 2012-08-11: qty 1

## 2012-08-11 MED ORDER — LISINOPRIL 20 MG PO TABS
20.0000 mg | ORAL_TABLET | Freq: Every day | ORAL | Status: DC
  Administered 2012-08-11: 20 mg via ORAL
  Filled 2012-08-11 (×2): qty 1

## 2012-08-11 MED ORDER — GLYCOPYRROLATE 0.2 MG/ML IJ SOLN
INTRAMUSCULAR | Status: DC | PRN
  Administered 2012-08-11: 0.2 mg via INTRAVENOUS
  Administered 2012-08-11: 0.4 mg via INTRAVENOUS

## 2012-08-11 MED ORDER — SENNA 8.6 MG PO TABS
1.0000 | ORAL_TABLET | Freq: Two times a day (BID) | ORAL | Status: DC
  Administered 2012-08-11: 8.6 mg via ORAL
  Filled 2012-08-11 (×4): qty 1

## 2012-08-11 MED ORDER — HYDROCODONE-ACETAMINOPHEN 5-325 MG PO TABS: 1.0000 | ORAL_TABLET | ORAL | Status: DC | PRN

## 2012-08-11 MED ORDER — OXYCODONE HCL 5 MG/5ML PO SOLN: 5.0000 mg | Freq: Once | ORAL | Status: DC | PRN

## 2012-08-11 MED ORDER — MENTHOL 3 MG MT LOZG: 1.0000 | LOZENGE | OROMUCOSAL | Status: DC | PRN

## 2012-08-11 MED ORDER — ACETAMINOPHEN 650 MG RE SUPP: 650.0000 mg | RECTAL | Status: DC | PRN

## 2012-08-11 MED ORDER — CITALOPRAM HYDROBROMIDE 20 MG PO TABS
20.0000 mg | ORAL_TABLET | Freq: Every day | ORAL | Status: DC
  Filled 2012-08-11: qty 1

## 2012-08-11 MED ORDER — THROMBIN 20000 UNITS EX SOLR
CUTANEOUS | Status: DC | PRN
  Administered 2012-08-11: 09:00:00 via TOPICAL

## 2012-08-11 MED ORDER — INSULIN DETEMIR 100 UNIT/ML ~~LOC~~ SOLN: 14.0000 [IU] | Freq: Two times a day (BID) | SUBCUTANEOUS | Status: DC

## 2012-08-11 MED ORDER — SODIUM CHLORIDE 0.9 % IV SOLN
INTRAVENOUS | Status: AC
  Filled 2012-08-11: qty 500

## 2012-08-11 MED ORDER — DEXAMETHASONE SODIUM PHOSPHATE 10 MG/ML IJ SOLN
10.0000 mg | INTRAMUSCULAR | Status: AC
  Administered 2012-08-11: 10 mg via INTRAVENOUS

## 2012-08-11 MED ORDER — EPHEDRINE SULFATE 50 MG/ML IJ SOLN
INTRAMUSCULAR | Status: DC | PRN
  Administered 2012-08-11 (×2): 15 mg via INTRAVENOUS
  Administered 2012-08-11 (×3): 10 mg via INTRAVENOUS
  Administered 2012-08-11: 5 mg via INTRAVENOUS

## 2012-08-11 MED ORDER — CYCLOBENZAPRINE HCL 10 MG PO TABS: 10.0000 mg | ORAL_TABLET | Freq: Three times a day (TID) | ORAL | Status: DC | PRN

## 2012-08-11 MED ORDER — ASPIRIN 81 MG PO TABS: 81.0000 mg | ORAL_TABLET | Freq: Every day | ORAL | Status: DC

## 2012-08-11 MED ORDER — DEXAMETHASONE SODIUM PHOSPHATE 10 MG/ML IJ SOLN
INTRAMUSCULAR | Status: AC
  Filled 2012-08-11: qty 1

## 2012-08-11 MED ORDER — MEPERIDINE HCL 25 MG/ML IJ SOLN: 6.2500 mg | INTRAMUSCULAR | Status: DC | PRN

## 2012-08-11 SURGICAL SUPPLY — 57 items
BAG DECANTER FOR FLEXI CONT (MISCELLANEOUS) ×2 IMPLANT
BENZOIN TINCTURE PRP APPL 2/3 (GAUZE/BANDAGES/DRESSINGS) ×2 IMPLANT
BLADE SURG ROTATE 9660 (MISCELLANEOUS) IMPLANT
BRUSH SCRUB EZ PLAIN DRY (MISCELLANEOUS) ×2 IMPLANT
BUR CUTTER 7.0 ROUND (BURR) ×2 IMPLANT
BUR MATCHSTICK NEURO 3.0 LAGG (BURR) ×2 IMPLANT
CANISTER SUCTION 2500CC (MISCELLANEOUS) ×2 IMPLANT
CLOTH BEACON ORANGE TIMEOUT ST (SAFETY) ×2 IMPLANT
CONT SPEC 4OZ CLIKSEAL STRL BL (MISCELLANEOUS) ×2 IMPLANT
DECANTER SPIKE VIAL GLASS SM (MISCELLANEOUS) IMPLANT
DERMABOND ADHESIVE PROPEN (GAUZE/BANDAGES/DRESSINGS) ×1
DERMABOND ADVANCED (GAUZE/BANDAGES/DRESSINGS) ×1
DERMABOND ADVANCED .7 DNX12 (GAUZE/BANDAGES/DRESSINGS) ×1 IMPLANT
DERMABOND ADVANCED .7 DNX6 (GAUZE/BANDAGES/DRESSINGS) ×1 IMPLANT
DRAPE LAPAROTOMY 100X72X124 (DRAPES) ×2 IMPLANT
DRAPE MICROSCOPE LEICA (MISCELLANEOUS) ×2 IMPLANT
DRAPE MICROSCOPE ZEISS OPMI (DRAPES) IMPLANT
DRAPE POUCH INSTRU U-SHP 10X18 (DRAPES) ×2 IMPLANT
DRAPE PROXIMA HALF (DRAPES) IMPLANT
DRAPE SURG 17X23 STRL (DRAPES) ×8 IMPLANT
DURASEAL SPINE SEALANT 3ML (MISCELLANEOUS) ×2 IMPLANT
ELECT REM PT RETURN 9FT ADLT (ELECTROSURGICAL) ×2
ELECTRODE REM PT RTRN 9FT ADLT (ELECTROSURGICAL) ×1 IMPLANT
GAUZE SPONGE 4X4 16PLY XRAY LF (GAUZE/BANDAGES/DRESSINGS) IMPLANT
GLOVE BIO SURGEON STRL SZ8.5 (GLOVE) ×2 IMPLANT
GLOVE BIOGEL PI IND STRL 7.0 (GLOVE) ×2 IMPLANT
GLOVE BIOGEL PI INDICATOR 7.0 (GLOVE) ×2
GLOVE ECLIPSE 8.5 STRL (GLOVE) ×2 IMPLANT
GLOVE EXAM NITRILE LRG STRL (GLOVE) IMPLANT
GLOVE EXAM NITRILE MD LF STRL (GLOVE) IMPLANT
GLOVE EXAM NITRILE XL STR (GLOVE) IMPLANT
GLOVE EXAM NITRILE XS STR PU (GLOVE) IMPLANT
GLOVE SS BIOGEL STRL SZ 8 (GLOVE) ×1 IMPLANT
GLOVE SUPERSENSE BIOGEL SZ 8 (GLOVE) ×1
GLOVE SURG SS PI 6.5 STRL IVOR (GLOVE) ×6 IMPLANT
GOWN BRE IMP SLV AUR LG STRL (GOWN DISPOSABLE) ×4 IMPLANT
GOWN BRE IMP SLV AUR XL STRL (GOWN DISPOSABLE) ×4 IMPLANT
GOWN STRL REIN 2XL LVL4 (GOWN DISPOSABLE) IMPLANT
KIT BASIN OR (CUSTOM PROCEDURE TRAY) ×2 IMPLANT
KIT ROOM TURNOVER OR (KITS) ×2 IMPLANT
MARKER SKIN DUAL TIP RULER LAB (MISCELLANEOUS) ×2 IMPLANT
NEEDLE HYPO 22GX1.5 SAFETY (NEEDLE) ×2 IMPLANT
NEEDLE SPNL 22GX3.5 QUINCKE BK (NEEDLE) ×2 IMPLANT
NS IRRIG 1000ML POUR BTL (IV SOLUTION) ×2 IMPLANT
PACK LAMINECTOMY NEURO (CUSTOM PROCEDURE TRAY) ×2 IMPLANT
PAD ARMBOARD 7.5X6 YLW CONV (MISCELLANEOUS) ×6 IMPLANT
RUBBERBAND STERILE (MISCELLANEOUS) ×4 IMPLANT
SPONGE GAUZE 4X4 12PLY (GAUZE/BANDAGES/DRESSINGS) ×2 IMPLANT
SPONGE SURGIFOAM ABS GEL SZ50 (HEMOSTASIS) IMPLANT
STRIP CLOSURE SKIN 1/2X4 (GAUZE/BANDAGES/DRESSINGS) ×2 IMPLANT
SUT VIC AB 2-0 CT1 18 (SUTURE) ×4 IMPLANT
SUT VIC AB 3-0 SH 8-18 (SUTURE) ×2 IMPLANT
SYR 20ML ECCENTRIC (SYRINGE) ×2 IMPLANT
TAPE CLOTH SURG 4X10 WHT LF (GAUZE/BANDAGES/DRESSINGS) ×2 IMPLANT
TOWEL OR 17X24 6PK STRL BLUE (TOWEL DISPOSABLE) ×2 IMPLANT
TOWEL OR 17X26 10 PK STRL BLUE (TOWEL DISPOSABLE) ×2 IMPLANT
WATER STERILE IRR 1000ML POUR (IV SOLUTION) ×2 IMPLANT

## 2012-08-11 NOTE — Preoperative (Signed)
Beta Blockers   Reason not to administer Beta Blockers:Not Applicable 

## 2012-08-11 NOTE — Brief Op Note (Signed)
08/11/2012  9:46 AM  PATIENT:  Lindsey West  71 y.o. female  PRE-OPERATIVE DIAGNOSIS:  Herniated Nucleus Pulposus  POST-OPERATIVE DIAGNOSIS:  Herniated Nucleus Pulposus  PROCEDURE:  Procedure(s) (LRB) with comments: THORACIC DISCECTOMY (Left) - Left Thoracic seven-eight transpedicular microdiskectomy   SURGEON:  Surgeon(s) and Role:    * Temple Pacini, MD - Primary  PHYSICIAN ASSISTANT:   ASSISTANTS: Jenkins    ANESTHESIA:   general  EBL:  Total I/O In: 1450 [I.V.:1450] Out: 50 [Blood:50]  BLOOD ADMINISTERED:none  DRAINS: none   LOCAL MEDICATIONS USED:  MARCAINE     SPECIMEN:  No Specimen  DISPOSITION OF SPECIMEN:  N/A  COUNTS:  YES  TOURNIQUET:  * No tourniquets in log *  DICTATION: .Dragon Dictation  PLAN OF CARE: Admit for overnight observation  PATIENT DISPOSITION:  PACU - hemodynamically stable.   Delay start of Pharmacological VTE agent (>24hrs) due to surgical blood loss or risk of bleeding: yes

## 2012-08-11 NOTE — Anesthesia Procedure Notes (Signed)
Procedure Name: Intubation Date/Time: 08/11/2012 7:57 AM Performed by: Jerilee Hoh Pre-anesthesia Checklist: Patient identified, Emergency Drugs available, Suction available and Patient being monitored Patient Re-evaluated:Patient Re-evaluated prior to inductionOxygen Delivery Method: Circle system utilized Preoxygenation: Pre-oxygenation with 100% oxygen Intubation Type: IV induction Ventilation: Mask ventilation without difficulty Laryngoscope Size: Mac and 3 Grade View: Grade III Tube type: Oral Tube size: 7.5 mm Number of attempts: 1 Airway Equipment and Method: Stylet Placement Confirmation: ETT inserted through vocal cords under direct vision,  positive ETCO2 and breath sounds checked- equal and bilateral Secured at: 22 cm Tube secured with: Tape Dental Injury: Teeth and Oropharynx as per pre-operative assessment  Comments: Smooth induction. Easy mask. DL x 1, Grade III view. Anterior airway, small oral opening, limited neck mobility.  Atraumatic oral intubation.

## 2012-08-11 NOTE — Op Note (Signed)
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  Asst. Surgeon: Lovell Sheehan  Anesthesia: General  Indication: 71 year old female with bilateral lower extremity weakness and incoordination consistent with a thoracic myelopathy her workup demonstrates evidence of any significant leftward T7-8 disc herniation with spinal cord compression. She presents now for transpedicular microdiscectomy in hopes of improving her symptoms.  Operative note: After induction of anesthesia, patient addition prone onto a Wilson frame and appropriate padded. Thoracic region prepped and draped sterilely. 10 blade used to make a linear skin incision. Supper off Henderson Newcomer performed the left side exposing what was thought to be the T7 and T8 levels. X-ray taken and the T8-9 level in standard then exposed. Dissection was then redirected 1 level cephalad. Laminotomies then performed using high-speed drill Kerrison rongeurs to remove the inferior aspect of lamina of T7 medial aspect of the T7-8 facet joint and the superior rim of the T8 lamina. Pedicle of T8 was then partially removed using high-speed drill. Marked and brought field these might resection. Thecal sac was verified. Epidermides pus was quite limited and cut. The spaces and injured with the drill through the superior aspect of the pedicle on the left side. The space was then carefully irrigated using pituitary rongeurs. Disc herniation was then pushed into the cavity using Epstein curettes. All elements the disc herniation were completely resected. There is no his injury to thecal sac and nerve roots. Wounds and copes we irrigated out solution. Gelfoam was placed topically for hemostasis and a good. Microscope compresses were  removed. Hemostasis muscle achieved with electrocautery. Wounds and close in layers with Vicryl sutures. Steri-Strips and sterile dressing were applied. There were no apparent complications. Patient tolerated the procedure well and returned to the recovery room postop.

## 2012-08-11 NOTE — Transfer of Care (Signed)
Immediate Anesthesia Transfer of Care Note  Patient: Lindsey West  Procedure(s) Performed: Procedure(s) (LRB) with comments: THORACIC DISCECTOMY (Left) - Left Thoracic seven-eight transpedicular microdiskectomy   Patient Location: PACU  Anesthesia Type: General  Level of Consciousness: patient cooperative, lethargic and responds to stimulation  Airway & Oxygen Therapy: Patient Spontanous Breathing and Patient connected to face mask oxygen  Post-op Assessment: Report given to PACU RN, Post -op Vital signs reviewed and stable and Patient moving all extremities  Post vital signs: Reviewed and stable  Complications: No apparent anesthesia complications

## 2012-08-11 NOTE — Anesthesia Preprocedure Evaluation (Signed)
Anesthesia Evaluation  Patient identified by MRN, date of birth, ID band Patient awake    Reviewed: Allergy & Precautions, H&P , NPO status , Patient's Chart, lab work & pertinent test results  Airway Mallampati: II TM Distance: >3 FB Neck ROM: Full    Dental   Pulmonary sleep apnea , COPD         Cardiovascular hypertension, Pt. on medications     Neuro/Psych    GI/Hepatic GERD-  Medicated and Controlled,  Endo/Other  diabetes, Well Controlled, Type 2, Insulin Dependent and Oral Hypoglycemic Agents  Renal/GU      Musculoskeletal   Abdominal   Peds  Hematology   Anesthesia Other Findings   Reproductive/Obstetrics                           Anesthesia Physical Anesthesia Plan  ASA: III  Anesthesia Plan: General   Post-op Pain Management:    Induction: Intravenous  Airway Management Planned: Oral ETT  Additional Equipment:   Intra-op Plan:   Post-operative Plan: Extubation in OR  Informed Consent: I have reviewed the patients History and Physical, chart, labs and discussed the procedure including the risks, benefits and alternatives for the proposed anesthesia with the patient or authorized representative who has indicated his/her understanding and acceptance.     Plan Discussed with: CRNA and Surgeon  Anesthesia Plan Comments:         Anesthesia Quick Evaluation

## 2012-08-11 NOTE — H&P (Signed)
Lindsey West is an 71 y.o. female.   Chief Complaint: Bilateral leg weakness and clumsiness HPI: 71 year old female with bilateral lower extremity ataxia and weakness. She is failed conservative management. She is suffering multiple falls. Workup has demonstrated evidence of a left-sided T7-8 disc herniation with spinal cord compression. She has symptoms worrisome for her thoracic myelopathy and presents now for microdiscectomy in hopes of improving her symptoms.  Past Medical History  Diagnosis Date  . GERD (gastroesophageal reflux disease)   . Diabetes mellitus without complication   . Hypertension   . Hypothyroidism   . Depression   . Asthma   . COPD (chronic obstructive pulmonary disease)   . Shortness of breath   . Sleep apnea     CPAP, takes off during night  . Arthritis   . Cancer     skin, thyroid, colon/intestinal    Past Surgical History  Procedure Date  . Back surgery     lumbar  . Cervical spine surgery   . Abdominal hysterectomy   . Tonsillectomy   . Appendectomy   . Cholecystectomy   . Breast surgery     4 biopsies  . Colon surgery     partial colectomy?  . Thyroidectomy   . Melanoma excision     L arm    History reviewed. No pertinent family history. Social History:  does not have a smoking history on file. She does not have any smokeless tobacco history on file. Her alcohol and drug histories not on file.  Allergies:  Allergies  Allergen Reactions  . Codeine Other (See Comments)    hallucinations  . Valium Other (See Comments)    Makes patient cry    Medications Prior to Admission  Medication Sig Dispense Refill  . amitriptyline (ELAVIL) 100 MG tablet Take 100 mg by mouth at bedtime.      Marland Kitchen aspirin 81 MG tablet Take 81 mg by mouth daily.      . citalopram (CELEXA) 20 MG tablet Take 20 mg by mouth daily.      . fish oil-omega-3 fatty acids 1000 MG capsule Take 2 g by mouth 2 (two) times daily.      . Fluticasone-Salmeterol (ADVAIR) 250-50  MCG/DOSE AEPB Inhale 2 puffs into the lungs daily.      . insulin detemir (LEVEMIR) 100 UNIT/ML injection Inject 14-18 Units into the skin 2 (two) times daily. Per sliding scale      . insulin regular (NOVOLIN R,HUMULIN R) 100 units/mL injection Inject 30-38 Units into the skin 2 (two) times daily before a meal. 30 units in the morning and 38 units in the evening      . levothyroxine (SYNTHROID, LEVOTHROID) 150 MCG tablet Take 150 mcg by mouth daily. 1.5 tablets on Sundays, 1 tablet all other days      . lisinopril (PRINIVIL,ZESTRIL) 20 MG tablet Take 20 mg by mouth daily.      Marland Kitchen loratadine (CLARITIN) 10 MG tablet Take 10 mg by mouth daily as needed. For allergies      . metFORMIN (GLUCOPHAGE) 1000 MG tablet Take 1,000 mg by mouth daily with breakfast.      . metoprolol succinate (TOPROL-XL) 100 MG 24 hr tablet Take 100 mg by mouth daily. Take with or immediately following a meal.      . montelukast (SINGULAIR) 10 MG tablet Take 10 mg by mouth every morning.      Marland Kitchen omeprazole (PRILOSEC) 20 MG capsule Take 20 mg by mouth daily.      Marland Kitchen  Vitamin D, Ergocalciferol, (DRISDOL) 50000 UNITS CAPS Take 50,000 Units by mouth every 7 (seven) days. On Sundays        Results for orders placed during the hospital encounter of 08/11/12 (from the past 48 hour(s))  GLUCOSE, CAPILLARY     Status: Abnormal   Collection Time   08/11/12  6:25 AM      Component Value Range Comment   Glucose-Capillary 146 (*) 70 - 99 mg/dL    No results found.  Review of Systems  Constitutional: Negative.   HENT: Negative.   Eyes: Negative.   Respiratory: Negative.   Cardiovascular: Negative.   Gastrointestinal: Negative.   Genitourinary: Negative.   Musculoskeletal: Negative.   Skin: Negative.   Neurological: Negative.   Endo/Heme/Allergies: Negative.   Psychiatric/Behavioral: Negative.     Blood pressure 149/75, pulse 58, temperature 98 F (36.7 C), temperature source Oral, resp. rate 18, SpO2 97.00%. Physical Exam    Constitutional: She is oriented to person, place, and time. She appears well-developed and well-nourished.  HENT:  Head: Normocephalic and atraumatic.  Right Ear: External ear normal.  Left Ear: External ear normal.  Nose: Nose normal.  Mouth/Throat: Oropharynx is clear and moist.  Eyes: Conjunctivae normal and EOM are normal. Pupils are equal, round, and reactive to light.  Neck: Normal range of motion. Neck supple.  Cardiovascular: Normal rate, regular rhythm, normal heart sounds and intact distal pulses.   Respiratory: Effort normal and breath sounds normal. No respiratory distress. She has no wheezes.  GI: Soft. Bowel sounds are normal. She exhibits no distension. There is no tenderness.  Musculoskeletal: Normal range of motion. She exhibits no edema and no tenderness.  Neurological: She is alert and oriented to person, place, and time. She displays abnormal reflex. No cranial nerve deficit. She exhibits normal muscle tone. Coordination abnormal.  Skin: Skin is warm and dry. No rash noted. No erythema. No pallor.  Psychiatric: She has a normal mood and affect. Her behavior is normal. Judgment and thought content normal.     Assessment/Plan Left T7-8 herniated nucleus pulposus with myelopathy. Plan left T7-8 transpedicular microdiscectomy. Risks and benefits been explained. Patient wishes to proceed.  Lindsey West A 08/11/2012, 7:35 AM

## 2012-08-11 NOTE — Anesthesia Postprocedure Evaluation (Signed)
Anesthesia Post Note  Patient: Lindsey West  Procedure(s) Performed: Procedure(s) (LRB): THORACIC DISCECTOMY (Left)  Anesthesia type: general  Patient location: PACU  Post pain: Pain level controlled  Post assessment: Patient's Cardiovascular Status Stable  Last Vitals:  Filed Vitals:   08/11/12 1004  BP: 112/59  Pulse: 61  Temp:   Resp: 23    Post vital signs: Reviewed and stable  Level of consciousness: sedated  Complications: No apparent anesthesia complications

## 2012-08-11 NOTE — Plan of Care (Signed)
Problem: Consults Goal: Diagnosis - Spinal Surgery Outcome: Completed/Met Date Met:  08/11/12 Microdiscectomy

## 2012-08-12 LAB — GLUCOSE, CAPILLARY: Glucose-Capillary: 209 mg/dL — ABNORMAL HIGH (ref 70–99)

## 2012-08-12 MED ORDER — HYDROCODONE-ACETAMINOPHEN 10-325 MG PO TABS
1.0000 | ORAL_TABLET | ORAL | Status: DC | PRN
Start: 2012-08-12 — End: 2024-07-21

## 2012-08-12 MED ORDER — CYCLOBENZAPRINE HCL 10 MG PO TABS: 10.0000 mg | ORAL_TABLET | Freq: Three times a day (TID) | ORAL | Status: DC | PRN

## 2012-08-12 NOTE — Progress Notes (Signed)
Pt given D/C instructions with Rx's. Verbal understanding given by Pt. Pt D/C'd home via wheelchair @ [phone] per MD order. Rema Fendt, RN

## 2012-08-12 NOTE — Discharge Summary (Signed)
Physician Discharge Summary  Patient ID: Lindsey West MRN: [id number] DOB/AGE: 73-Feb-1942 73 y.o.  Admit date: 08/11/2012 Discharge date: 08/12/2012  Admission Diagnoses:  Discharge Diagnoses:  Principal Problem:  *Thoracic disc herniation   Discharged Condition: good  Hospital Course: Admitted to the hospital where she underwent uncomplicated T7-T8 transmitter microdiscectomy patient reports her back pain is better in her lower extremity function is much improved. Angled were without difficulty. Doing well overall. Rate for discharge home.  Consults:   Significant Diagnostic Studies:   Treatments:   Discharge Exam: Blood pressure 140/91, pulse 98, temperature 98.2 F (36.8 C), temperature source Oral, resp. rate 20, SpO2 100.00%. Awake and alert oriented appropriate. Cranial nerve function is intact. Motor sensory function extremities is normal. Wound clean dry and intact. Chest and abdomen benign.  Disposition: 01-Home or Self Care     Medication List     As of 08/12/2012  8:15 AM    TAKE these medications         amitriptyline 100 MG tablet   Commonly known as: ELAVIL   Take 100 mg by mouth at bedtime.      aspirin 81 MG tablet   Take 81 mg by mouth daily.      citalopram 20 MG tablet   Commonly known as: CELEXA   Take 20 mg by mouth daily.      cyclobenzaprine 10 MG tablet   Commonly known as: FLEXERIL   Take 1 tablet (10 mg total) by mouth 3 (three) times daily as needed for muscle spasms.      fish oil-omega-3 fatty acids 1000 MG capsule   Take 2 g by mouth 2 (two) times daily.      Fluticasone-Salmeterol 250-50 MCG/DOSE Aepb   Commonly known as: ADVAIR   Inhale 2 puffs into the lungs daily.      HYDROcodone-acetaminophen 10-325 MG per tablet   Commonly known as: NORCO   Take 1-2 tablets by mouth every 4 (four) hours as needed for pain.      insulin detemir 100 UNIT/ML injection   Commonly known as: LEVEMIR   Inject 14-18 Units into the skin  2 (two) times daily. Per sliding scale      insulin regular 100 units/mL injection   Commonly known as: NOVOLIN R,HUMULIN R   Inject 30-38 Units into the skin 2 (two) times daily before a meal. 30 units in the morning and 38 units in the evening      levothyroxine 150 MCG tablet   Commonly known as: SYNTHROID, LEVOTHROID   Take 150 mcg by mouth daily. 1.5 tablets on Sundays, 1 tablet all other days      lisinopril 20 MG tablet   Commonly known as: PRINIVIL,ZESTRIL   Take 20 mg by mouth daily.      loratadine 10 MG tablet   Commonly known as: CLARITIN   Take 10 mg by mouth daily as needed. For allergies      metFORMIN 1000 MG tablet   Commonly known as: GLUCOPHAGE   Take 1,000 mg by mouth daily with breakfast.      metoprolol succinate 100 MG 24 hr tablet   Commonly known as: TOPROL-XL   Take 100 mg by mouth daily. Take with or immediately following a meal.      montelukast 10 MG tablet   Commonly known as: SINGULAIR   Take 10 mg by mouth every morning.      omeprazole 20 MG capsule   Commonly known as: PRILOSEC  Take 20 mg by mouth daily.      Vitamin D (Ergocalciferol) 50000 UNITS Caps   Commonly known as: DRISDOL   Take 50,000 Units by mouth every 7 (seven) days. On Sundays           Follow-up Information    Follow up with Esta Carmon A, MD. Call in 1 week.   Contact information:   [address] [place] [address] [phone]          Signed: Kerly Rigsbee A 08/12/2012, 8:15 AM

## 2012-09-05 MED FILL — Thyrotropin Alfa For Inj 1.1 MG: INTRAMUSCULAR | Qty: 0.9 | Status: AC

## 2012-10-08 ENCOUNTER — Other Ambulatory Visit (HOSPITAL_COMMUNITY): Payer: Self-pay | Admitting: Neurosurgery

## 2012-10-08 DIAGNOSIS — IMO0002 Reserved for concepts with insufficient information to code with codable children: Secondary | ICD-10-CM

## 2012-10-09 ENCOUNTER — Ambulatory Visit (HOSPITAL_COMMUNITY): Payer: Medicare Other

## 2012-10-09 ENCOUNTER — Ambulatory Visit (HOSPITAL_COMMUNITY)
Admission: RE | Admit: 2012-10-09 | Discharge: 2012-10-09 | Disposition: A | Discharge status: Home / Self Care | Payer: Medicare Other | Source: Ambulatory Visit | Attending: Neurosurgery | Admitting: Neurosurgery

## 2012-10-09 DIAGNOSIS — IMO0002 Reserved for concepts with insufficient information to code with codable children: Secondary | ICD-10-CM | POA: Insufficient documentation

## 2012-10-09 LAB — CREATININE, SERUM
Creatinine, Ser: 1.22 mg/dL — ABNORMAL HIGH (ref 0.50–1.10)
GFR calc non Af Amer: 43 mL/min — ABNORMAL LOW (ref >90)

## 2012-10-09 MED ORDER — GADOBENATE DIMEGLUMINE 529 MG/ML IV SOLN
20.0000 mL | Freq: Once | INTRAVENOUS | Status: AC
  Administered 2012-10-09: 20 mL via INTRAVENOUS

## 2012-10-12 ENCOUNTER — Encounter (HOSPITAL_COMMUNITY): Payer: Self-pay | Admitting: Emergency Medicine

## 2012-10-12 DIAGNOSIS — E039 Hypothyroidism, unspecified: Secondary | ICD-10-CM | POA: Diagnosis present

## 2012-10-12 DIAGNOSIS — Z7982 Long term (current) use of aspirin: Secondary | ICD-10-CM

## 2012-10-12 DIAGNOSIS — I10 Essential (primary) hypertension: Secondary | ICD-10-CM | POA: Diagnosis present

## 2012-10-12 DIAGNOSIS — E1142 Type 2 diabetes mellitus with diabetic polyneuropathy: Secondary | ICD-10-CM | POA: Diagnosis present

## 2012-10-12 DIAGNOSIS — E1149 Type 2 diabetes mellitus with other diabetic neurological complication: Secondary | ICD-10-CM | POA: Diagnosis present

## 2012-10-12 DIAGNOSIS — F329 Major depressive disorder, single episode, unspecified: Secondary | ICD-10-CM | POA: Diagnosis present

## 2012-10-12 DIAGNOSIS — J4489 Other specified chronic obstructive pulmonary disease: Secondary | ICD-10-CM | POA: Diagnosis present

## 2012-10-12 DIAGNOSIS — F3289 Other specified depressive episodes: Secondary | ICD-10-CM | POA: Diagnosis present

## 2012-10-12 DIAGNOSIS — Z794 Long term (current) use of insulin: Secondary | ICD-10-CM

## 2012-10-12 DIAGNOSIS — Z85038 Personal history of other malignant neoplasm of large intestine: Secondary | ICD-10-CM

## 2012-10-12 DIAGNOSIS — J449 Chronic obstructive pulmonary disease, unspecified: Secondary | ICD-10-CM | POA: Diagnosis present

## 2012-10-12 DIAGNOSIS — Z85828 Personal history of other malignant neoplasm of skin: Secondary | ICD-10-CM

## 2012-10-12 DIAGNOSIS — K219 Gastro-esophageal reflux disease without esophagitis: Secondary | ICD-10-CM | POA: Diagnosis present

## 2012-10-12 DIAGNOSIS — Z85850 Personal history of malignant neoplasm of thyroid: Secondary | ICD-10-CM

## 2012-10-12 DIAGNOSIS — G4730 Sleep apnea, unspecified: Secondary | ICD-10-CM | POA: Diagnosis present

## 2012-10-12 DIAGNOSIS — S22009A Unspecified fracture of unspecified thoracic vertebra, initial encounter for closed fracture: Principal | ICD-10-CM | POA: Diagnosis present

## 2012-10-12 DIAGNOSIS — X500XXA Overexertion from strenuous movement or load, initial encounter: Secondary | ICD-10-CM | POA: Diagnosis present

## 2012-10-12 DIAGNOSIS — Z79899 Other long term (current) drug therapy: Secondary | ICD-10-CM

## 2012-10-12 DIAGNOSIS — M129 Arthropathy, unspecified: Secondary | ICD-10-CM | POA: Diagnosis present

## 2012-10-12 NOTE — ED Notes (Addendum)
Patient had back surgery on 08/11/12.  Now with increased back pain and neck pain.  Patient denies any injury or recent fall.

## 2012-10-13 ENCOUNTER — Observation Stay (HOSPITAL_COMMUNITY): Payer: Medicare Other

## 2012-10-13 ENCOUNTER — Encounter (HOSPITAL_COMMUNITY): Payer: Self-pay | Admitting: General Practice

## 2012-10-13 ENCOUNTER — Inpatient Hospital Stay (HOSPITAL_COMMUNITY)
Admission: EM | Admit: 2012-10-13 | Discharge: 2012-10-17 | DRG: 552 | Disposition: A | Discharge status: Home Health | Payer: Medicare Other | Attending: Neurosurgery | Admitting: Neurosurgery

## 2012-10-13 DIAGNOSIS — S22009A Unspecified fracture of unspecified thoracic vertebra, initial encounter for closed fracture: Secondary | ICD-10-CM | POA: Diagnosis present

## 2012-10-13 DIAGNOSIS — M549 Dorsalgia, unspecified: Secondary | ICD-10-CM

## 2012-10-13 HISTORY — DX: Myoneural disorder, unspecified: G70.9

## 2012-10-13 LAB — CBC WITH DIFFERENTIAL/PLATELET
Basophils Absolute: 0 10*3/uL (ref 0.0–0.1)
Basophils Absolute: 0 10*3/uL (ref 0.0–0.1)
Basophils Relative: 0 % (ref 0–1)
Basophils Relative: 0 % (ref 0–1)
Eosinophils Absolute: 0.1 10*3/uL (ref 0.0–0.7)
Eosinophils Absolute: 0 10*3/uL (ref 0.0–0.7)
Eosinophils Relative: 1 % (ref 0–5)
Eosinophils Relative: 1 % (ref 0–5)
HCT: 36 % (ref 36.0–46.0)
HCT: 36 % (ref 36.0–46.0)
Hemoglobin: 11.4 g/dL — ABNORMAL LOW (ref 12.0–15.0)
Hemoglobin: 11.1 g/dL — ABNORMAL LOW (ref 12.0–15.0)
Lymphocytes Relative: 20 % (ref 12–46)
Lymphocytes Relative: 21 % (ref 12–46)
Lymphs Abs: 1.4 10*3/uL (ref 0.7–4.0)
Lymphs Abs: 1.1 10*3/uL (ref 0.7–4.0)
MCH: 29.6 pg (ref 26.0–34.0)
MCH: 29.4 pg (ref 26.0–34.0)
MCHC: 31.7 g/dL (ref 30.0–36.0)
MCHC: 30.8 g/dL (ref 30.0–36.0)
MCV: 93.5 fL (ref 78.0–100.0)
MCV: 95.5 fL (ref 78.0–100.0)
Monocytes Absolute: 0.8 10*3/uL (ref 0.1–1.0)
Monocytes Absolute: 0.8 10*3/uL (ref 0.1–1.0)
Monocytes Relative: 12 % (ref 3–12)
Monocytes Relative: 16 % — ABNORMAL HIGH (ref 3–12)
Neutro Abs: 4.5 10*3/uL (ref 1.7–7.7)
Neutro Abs: 3.1 10*3/uL (ref 1.7–7.7)
Neutrophils Relative %: 67 % (ref 43–77)
Neutrophils Relative %: 62 % (ref 43–77)
Platelets: 203 10*3/uL (ref 150–400)
Platelets: 172 10*3/uL (ref 150–400)
RBC: 3.85 MIL/uL — ABNORMAL LOW (ref 3.87–5.11)
RBC: 3.77 MIL/uL — ABNORMAL LOW (ref 3.87–5.11)
RDW: 13.8 % (ref 11.5–15.5)
RDW: 13.9 % (ref 11.5–15.5)
WBC: 6.7 10*3/uL (ref 4.0–10.5)
WBC: 5.1 10*3/uL (ref 4.0–10.5)

## 2012-10-13 LAB — BASIC METABOLIC PANEL
CO2: 27 mEq/L (ref 19–32)
Chloride: 95 mEq/L — ABNORMAL LOW (ref 96–112)
Glucose, Bld: 296 mg/dL — ABNORMAL HIGH (ref 70–99)
Potassium: 4.5 mEq/L (ref 3.5–5.1)
Sodium: 131 mEq/L — ABNORMAL LOW (ref 135–145)

## 2012-10-13 LAB — SEDIMENTATION RATE: Sed Rate: 49 mm/hr — ABNORMAL HIGH (ref 0–22)

## 2012-10-13 LAB — GLUCOSE, CAPILLARY
Glucose-Capillary: 259 mg/dL — ABNORMAL HIGH (ref 70–99)
Glucose-Capillary: 160 mg/dL — ABNORMAL HIGH (ref 70–99)

## 2012-10-13 MED ORDER — ASPIRIN 81 MG PO TABS: 81.0000 mg | ORAL_TABLET | Freq: Every day | ORAL | Status: DC

## 2012-10-13 MED ORDER — ASPIRIN EC 81 MG PO TBEC
81.0000 mg | DELAYED_RELEASE_TABLET | Freq: Every day | ORAL | Status: DC
  Administered 2012-10-13 – 2012-10-17 (×5): 81 mg via ORAL
  Filled 2012-10-13 (×5): qty 1

## 2012-10-13 MED ORDER — NALOXONE HCL 0.4 MG/ML IJ SOLN: 0.4000 mg | INTRAMUSCULAR | Status: DC | PRN

## 2012-10-13 MED ORDER — CITALOPRAM HYDROBROMIDE 20 MG PO TABS
20.0000 mg | ORAL_TABLET | Freq: Every day | ORAL | Status: DC
  Administered 2012-10-13 – 2012-10-17 (×5): 20 mg via ORAL
  Filled 2012-10-13 (×5): qty 1

## 2012-10-13 MED ORDER — HYDROMORPHONE 0.3 MG/ML IV SOLN
INTRAVENOUS | Status: DC
  Administered 2012-10-13: 18:00:00 via INTRAVENOUS
  Administered 2012-10-13: 1.2 mg via INTRAVENOUS
  Administered 2012-10-14 (×2): 0.3 mg via INTRAVENOUS
  Administered 2012-10-14: 0.0539 mg via INTRAVENOUS
  Administered 2012-10-14: 0.9 mg via INTRAVENOUS
  Administered 2012-10-14: 0.6 mg via INTRAVENOUS
  Administered 2012-10-15: 1.2 mg via INTRAVENOUS
  Administered 2012-10-15: 0.01 mg via INTRAVENOUS
  Administered 2012-10-15 (×2): 0.3 mg via INTRAVENOUS
  Administered 2012-10-15: 0.6 mg via INTRAVENOUS
  Administered 2012-10-15: 15:00:00 via INTRAVENOUS
  Administered 2012-10-16 (×2): 0.9 mg via INTRAVENOUS
  Administered 2012-10-16: 0.3 mg via INTRAVENOUS
  Filled 2012-10-13 (×2): qty 25

## 2012-10-13 MED ORDER — INSULIN ASPART 100 UNIT/ML ~~LOC~~ SOLN
0.0000 [IU] | Freq: Every day | SUBCUTANEOUS | Status: DC
  Administered 2012-10-16: 3 [IU] via SUBCUTANEOUS

## 2012-10-13 MED ORDER — LEVOTHYROXINE SODIUM 150 MCG PO TABS
150.0000 ug | ORAL_TABLET | Freq: Every day | ORAL | Status: DC
  Administered 2012-10-13 – 2012-10-17 (×5): 150 ug via ORAL
  Filled 2012-10-13 (×7): qty 1

## 2012-10-13 MED ORDER — METOPROLOL SUCCINATE ER 100 MG PO TB24
100.0000 mg | ORAL_TABLET | Freq: Every day | ORAL | Status: DC
  Administered 2012-10-13 – 2012-10-17 (×5): 100 mg via ORAL
  Filled 2012-10-13 (×5): qty 1

## 2012-10-13 MED ORDER — METHOCARBAMOL 100 MG/ML IJ SOLN: 1000.0000 mg | Freq: Once | INTRAMUSCULAR | Status: DC

## 2012-10-13 MED ORDER — DIPHENHYDRAMINE HCL 50 MG/ML IJ SOLN: 12.5000 mg | Freq: Four times a day (QID) | INTRAMUSCULAR | Status: DC | PRN

## 2012-10-13 MED ORDER — INSULIN ASPART 100 UNIT/ML ~~LOC~~ SOLN
0.0000 [IU] | Freq: Three times a day (TID) | SUBCUTANEOUS | Status: DC
  Administered 2012-10-13: 8 [IU] via SUBCUTANEOUS
  Administered 2012-10-13 – 2012-10-15 (×5): 3 [IU] via SUBCUTANEOUS
  Administered 2012-10-15: 5 [IU] via SUBCUTANEOUS
  Administered 2012-10-15: 2 [IU] via SUBCUTANEOUS
  Administered 2012-10-16: 8 [IU] via SUBCUTANEOUS
  Administered 2012-10-16: 3 [IU] via SUBCUTANEOUS
  Administered 2012-10-16: 2 [IU] via SUBCUTANEOUS
  Administered 2012-10-17 (×3): 3 [IU] via SUBCUTANEOUS

## 2012-10-13 MED ORDER — DIPHENHYDRAMINE HCL 12.5 MG/5ML PO ELIX: 12.5000 mg | ORAL_SOLUTION | Freq: Four times a day (QID) | ORAL | Status: DC | PRN

## 2012-10-13 MED ORDER — AMITRIPTYLINE HCL 100 MG PO TABS
100.0000 mg | ORAL_TABLET | Freq: Every day | ORAL | Status: DC
  Administered 2012-10-13 – 2012-10-16 (×4): 100 mg via ORAL
  Filled 2012-10-13 (×5): qty 1

## 2012-10-13 MED ORDER — METHOCARBAMOL 100 MG/ML IJ SOLN
1000.0000 mg | Freq: Once | INTRAVENOUS | Status: AC
  Administered 2012-10-13: 1000 mg via INTRAVENOUS
  Filled 2012-10-13: qty 10

## 2012-10-13 MED ORDER — ONDANSETRON HCL 4 MG/2ML IJ SOLN: 4.0000 mg | Freq: Three times a day (TID) | INTRAMUSCULAR | Status: AC | PRN

## 2012-10-13 MED ORDER — SODIUM CHLORIDE 0.9 % IV SOLN: INTRAVENOUS | Status: AC

## 2012-10-13 MED ORDER — DIPHENHYDRAMINE HCL 25 MG PO CAPS
25.0000 mg | ORAL_CAPSULE | ORAL | Status: DC | PRN
  Administered 2012-10-13: 25 mg via ORAL
  Filled 2012-10-13: qty 1

## 2012-10-13 MED ORDER — OXYCODONE-ACETAMINOPHEN 5-325 MG PO TABS
2.0000 | ORAL_TABLET | Freq: Four times a day (QID) | ORAL | Status: DC | PRN
  Administered 2012-10-13 – 2012-10-14 (×2): 2 via ORAL
  Filled 2012-10-13 (×2): qty 2

## 2012-10-13 MED ORDER — ONDANSETRON HCL 4 MG/2ML IJ SOLN: 4.0000 mg | Freq: Four times a day (QID) | INTRAMUSCULAR | Status: DC | PRN

## 2012-10-13 MED ORDER — CYCLOBENZAPRINE HCL 10 MG PO TABS
10.0000 mg | ORAL_TABLET | Freq: Three times a day (TID) | ORAL | Status: DC | PRN
  Administered 2012-10-14 – 2012-10-17 (×5): 10 mg via ORAL
  Filled 2012-10-13 (×5): qty 1

## 2012-10-13 MED ORDER — SODIUM CHLORIDE 0.9 % IV SOLN
Freq: Once | INTRAVENOUS | Status: AC
  Administered 2012-10-13: 01:00:00 via INTRAVENOUS

## 2012-10-13 MED ORDER — LISINOPRIL 20 MG PO TABS
20.0000 mg | ORAL_TABLET | Freq: Every day | ORAL | Status: DC
  Administered 2012-10-13 – 2012-10-17 (×5): 20 mg via ORAL
  Filled 2012-10-13 (×5): qty 1

## 2012-10-13 MED ORDER — METFORMIN HCL 500 MG PO TABS
1000.0000 mg | ORAL_TABLET | Freq: Every day | ORAL | Status: DC
  Administered 2012-10-14 – 2012-10-17 (×3): 1000 mg via ORAL
  Filled 2012-10-13 (×5): qty 2

## 2012-10-13 MED ORDER — SODIUM CHLORIDE 0.9 % IJ SOLN: 9.0000 mL | INTRAMUSCULAR | Status: DC | PRN

## 2012-10-13 MED ORDER — MONTELUKAST SODIUM 10 MG PO TABS
10.0000 mg | ORAL_TABLET | Freq: Every morning | ORAL | Status: DC
  Administered 2012-10-13 – 2012-10-17 (×5): 10 mg via ORAL
  Filled 2012-10-13 (×5): qty 1

## 2012-10-13 MED ORDER — HYDROMORPHONE HCL PF 1 MG/ML IJ SOLN
1.0000 mg | Freq: Once | INTRAMUSCULAR | Status: AC
  Administered 2012-10-13: 1 mg via INTRAVENOUS
  Filled 2012-10-13: qty 1

## 2012-10-13 NOTE — Progress Notes (Signed)
ESR 49.. Doubt infection.  Suspect subacute compression fx.  Check CT.  PCA for pain control/.

## 2012-10-13 NOTE — H&P (Signed)
Lindsey West is an 71 y.o. female.   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 Lately she has been complaining of thoracic and nech\k pain. She was seen by dr Lindsey West last week and a TLSO was giving and a thoracic mri was obtained.she came  to the ER at 4 am with worsening of the pain. she denies sob or weakness  Percocet is not helping   Past Medical History  Diagnosis Date  . GERD (gastroesophageal reflux disease)   . Diabetes mellitus without complication   . Hypertension   . Hypothyroidism   . Depression   . Asthma   . COPD (chronic obstructive pulmonary disease)   . Shortness of breath   . Sleep apnea     CPAP, takes off during night  . Arthritis   . Cancer     skin, thyroid, colon/intestinal  . Neuromuscular disorder     DIABETIC NEAUROPATHY    Past Surgical History  Procedure Date  . Back surgery     lumbar  . Cervical spine surgery   . Abdominal hysterectomy   . Tonsillectomy   . Appendectomy   . Cholecystectomy   . Breast surgery     4 biopsies  . Colon surgery     partial colectomy?  . Thyroidectomy   . Melanoma excision     L arm    No family history on file. Social History:  reports that she quit smoking about 27 years ago. She has never used smokeless tobacco. She reports that she does not drink alcohol or use illicit drugs.  Allergies:  Allergies  Allergen Reactions  . Codeine Other (See Comments)    hallucinations  . Oxycodone     hallucinations  . Valium Other (See Comments)    Makes patient cry    Medications Prior to Admission  Medication Sig Dispense Refill  . amitriptyline (ELAVIL) 100 MG tablet Take 100 mg by mouth at bedtime.      Marland Kitchen aspirin 81 MG tablet Take 81 mg by mouth daily.      . citalopram (CELEXA) 20 MG tablet Take 20 mg by mouth daily.      . cyclobenzaprine (FLEXERIL) 10 MG tablet Take 1  tablet (10 mg total) by mouth 3 (three) times daily as needed for muscle spasms.  30 tablet  1  . fish oil-omega-3 fatty acids 1000 MG capsule Take 2 g by mouth 2 (two) times daily.      . Fluticasone-Salmeterol (ADVAIR) 250-50 MCG/DOSE AEPB Inhale 2 puffs into the lungs daily.      Marland Kitchen HYDROcodone-acetaminophen (NORCO) 10-325 MG per tablet Take 1-2 tablets by mouth every 4 (four) hours as needed for pain.  80 tablet  1  . insulin detemir (LEVEMIR) 100 UNIT/ML injection Inject 14-18 Units into the skin 2 (two) times daily. Per sliding scale      . insulin regular (NOVOLIN R,HUMULIN R) 100 units/mL injection Inject 30-38 Units into the skin 2 (two) times daily before a meal. 30 units in the morning and 38 units in the evening      .  levothyroxine (SYNTHROID, LEVOTHROID) 150 MCG tablet Take 150 mcg by mouth daily. 1.5 tablets on Sundays, 1 tablet all other days      . lisinopril (PRINIVIL,ZESTRIL) 20 MG tablet Take 20 mg by mouth daily.      Marland Kitchen loratadine (CLARITIN) 10 MG tablet Take 10 mg by mouth daily as needed. For allergies      . metFORMIN (GLUCOPHAGE) 1000 MG tablet Take 1,000 mg by mouth daily with breakfast.      . metoprolol succinate (TOPROL-XL) 100 MG 24 hr tablet Take 100 mg by mouth daily. Take with or immediately following a meal.      . montelukast (SINGULAIR) 10 MG tablet Take 10 mg by mouth every morning.      Marland Kitchen omeprazole (PRILOSEC) 20 MG capsule Take 20 mg by mouth daily.      . Vitamin D, Ergocalciferol, (DRISDOL) 50000 UNITS CAPS Take 50,000 Units by mouth every 7 (seven) days. On Sundays        Results for orders placed during the hospital encounter of 10/13/12 (from the past 48 hour(s))  CBC WITH DIFFERENTIAL     Status: Abnormal   Collection Time   10/13/12  1:46 AM      Component Value Range Comment   WBC 6.7  4.0 - 10.5 K/uL    RBC 3.85 (*) 3.87 - 5.11 MIL/uL    Hemoglobin 11.4 (*) 12.0 - 15.0 g/dL    HCT 09.8  11.9 - 14.7 %    MCV 93.5  78.0 - 100.0 fL    MCH 29.6   26.0 - 34.0 pg    MCHC 31.7  30.0 - 36.0 g/dL    RDW 82.9  56.2 - 13.0 %    Platelets 203  150 - 400 K/uL    Neutrophils Relative 67  43 - 77 %    Neutro Abs 4.5  1.7 - 7.7 K/uL    Lymphocytes Relative 20  12 - 46 %    Lymphs Abs 1.4  0.7 - 4.0 K/uL    Monocytes Relative 12  3 - 12 %    Monocytes Absolute 0.8  0.1 - 1.0 K/uL    Eosinophils Relative 1  0 - 5 %    Eosinophils Absolute 0.1  0.0 - 0.7 K/uL    Basophils Relative 0  0 - 1 %    Basophils Absolute 0.0  0.0 - 0.1 K/uL   BASIC METABOLIC PANEL     Status: Abnormal   Collection Time   10/13/12  1:46 AM      Component Value Range Comment   Sodium 131 (*) 135 - 145 mEq/L    Potassium 4.5  3.5 - 5.1 mEq/L    Chloride 95 (*) 96 - 112 mEq/L    CO2 27  19 - 32 mEq/L    Glucose, Bld 296 (*) 70 - 99 mg/dL    BUN 11  6 - 23 mg/dL    Creatinine, Ser 8.65  0.50 - 1.10 mg/dL    Calcium 8.8  8.4 - 78.4 mg/dL    GFR calc non Af Amer 65 (*) >90 mL/min    GFR calc Af Amer 75 (*) >90 mL/min    No results found.  Review of Systems  Constitutional: Negative.   HENT: Negative.   Eyes: Negative.   Respiratory: Negative.   Cardiovascular: Negative.   Gastrointestinal: Negative.   Musculoskeletal: Positive for back pain.  Skin: Negative for itching.  Neurological: Negative.   Endo/Heme/Allergies:  Negative.   Psychiatric/Behavioral: Negative.     Blood pressure 115/46, pulse 82, temperature 98 F (36.7 C), temperature source Oral, resp. rate 18, weight 107.956 kg (238 lb), SpO2 96.00%. Physical Exam hent. Nl. Neck anterior scar. Lungs clear. Cv.nl. Abdomen, soft, extremities, nl. musculo-skeletal. Tenderness in both trapezius. And around the midthoracic area, scar well healed with no drainage. Neuro, nl. Mri shows some collection in the epidural space, with collapse Ft the Vertebral body. White cells wnl  Assessment/Plan . Pain control. i will let dr Lindsey West know about her admission  [name] M 10/13/2012, 10:16 AM

## 2012-10-13 NOTE — Progress Notes (Signed)
Inpatient Diabetes Program Recommendations  AACE/ADA: New Consensus Statement on Inpatient Glycemic Control (2013)  Target Ranges:  Prepandial:   less than 140 mg/dL      Peak postprandial:   less than 180 mg/dL (1-2 hours)      Critically ill patients:  140 - 180 mg/dL  Results for KADIAN, BARCELLOS (MRN [id number]) as of 10/13/2012 14:55  Ref. Range 08/11/2012 12:31 08/11/2012 17:33 08/11/2012 21:50 08/12/2012 08:29 10/13/2012 11:57  Glucose-Capillary Latest Range: 70-99 mg/dL 578 (H) 469 (H) 629 (H) 209 (H) 259 (H)   Inpatient Diabetes Program Recommendations Insulin - Basal: Start Levemir home dose  Will follow. Thank you  Piedad Climes [facility] Inpatient Diabetes Coordinator [phone]

## 2012-10-13 NOTE — Progress Notes (Signed)
   CARE MANAGEMENT NOTE 10/13/2012  Patient:  Lindsey West, Lindsey West   Account Number:  [id number]  Date Initiated:  10/13/2012  Documentation initiated by:  [facility]  Subjective/Objective Assessment:   Thoracic Discectomy     Action/Plan:   lives at home with husband   Anticipated DC Date:     Anticipated DC Plan:  HOME W HOME HEALTH SERVICES      DC Planning Services  CM consult      Choice offered to / List presented to:             Status of service:  In process, will continue to follow Medicare Important Message given?   (If response is "NO", the following Medicare IM given date fields will be blank) Date Medicare IM given:   Date Additional Medicare IM given:    Discharge Disposition:    Per UR Regulation:  Reviewed for med. necessity/level of care/duration of stay  If discussed at Long Length of Stay Meetings, dates discussed:    Comments:  10/13/2012 1530 NCM spoke to pt and she states she lives at home with husband. She did not require any HH after her last surgery. She is waiting for recommendations from her surgeon. Pt complaining of pain 9/10 on pain scale. States she has not made her RN aware. NCM contacted Unit RN to make aware of pt's complaints of severe pain.  Isidoro Donning RN CCM Case Mgmt phone [phone]

## 2012-10-13 NOTE — ED Provider Notes (Addendum)
History     CSN: [id number]  Arrival date & time 10/12/12  2313   First MD Initiated Contact with Patient 10/13/12 0049      Chief Complaint  Patient presents with  . Back Pain  . Neck Pain    (Consider location/radiation/quality/duration/timing/severity/associated sxs/prior treatment) HPI Comments: 71 year old female with a history of several back surgeries, the first which was last year and the second which was 2 months ago and was thoracic in location who presents with a complaint of worsening back pain and left-sided neck pain. The patient was seen by her spinal surgeon Dr. Dutch Quint on Friday, MRI was performed looking for infection or other abnormalities. The patient was placed in a spinal brace but has not had any improvement in her pain. She has been taking oxycodone with minimal relief. At this time her pain is severe, it radiates to the left side of her neck and she has severe pain with movement or palpation of the left side of her neck. This has not been a problem that she has had in the past. She denies numbness or weakness to her arms or her legs. She did have a fever last week but this has not been persistent. She denies nausea or vomiting, diarrhea, swelling or rashes.  The history is provided by the patient, the spouse and medical records.    Past Medical History  Diagnosis Date  . GERD (gastroesophageal reflux disease)   . Diabetes mellitus without complication   . Hypertension   . Hypothyroidism   . Depression   . Asthma   . COPD (chronic obstructive pulmonary disease)   . Shortness of breath   . Sleep apnea     CPAP, takes off during night  . Arthritis   . Cancer     skin, thyroid, colon/intestinal    Past Surgical History  Procedure Date  . Back surgery     lumbar  . Cervical spine surgery   . Abdominal hysterectomy   . Tonsillectomy   . Appendectomy   . Cholecystectomy   . Breast surgery     4 biopsies  . Colon surgery     partial colectomy?  .  Thyroidectomy   . Melanoma excision     L arm    No family history on file.  History  Substance Use Topics  . Smoking status: Not on file  . Smokeless tobacco: Not on file  . Alcohol Use:     OB History    Grav Para Term Preterm Abortions TAB SAB Ect Mult Living                  Review of Systems  All other systems reviewed and are negative.    Allergies  Codeine and Valium  Home Medications   Current Outpatient Rx  Name  Route  Sig  Dispense  Refill  . AMITRIPTYLINE HCL 100 MG PO TABS   Oral   Take 100 mg by mouth at bedtime.         . ASPIRIN 81 MG PO TABS   Oral   Take 81 mg by mouth daily.         Marland Kitchen CITALOPRAM HYDROBROMIDE 20 MG PO TABS   Oral   Take 20 mg by mouth daily.         . CYCLOBENZAPRINE HCL 10 MG PO TABS   Oral   Take 1 tablet (10 mg total) by mouth 3 (three) times daily as needed for muscle spasms.  30 tablet   1   . OMEGA-3 FATTY ACIDS 1000 MG PO CAPS   Oral   Take 2 g by mouth 2 (two) times daily.         Marland Kitchen FLUTICASONE-SALMETEROL 250-50 MCG/DOSE IN AEPB   Inhalation   Inhale 2 puffs into the lungs daily.         Marland Kitchen HYDROCODONE-ACETAMINOPHEN 10-325 MG PO TABS   Oral   Take 1-2 tablets by mouth every 4 (four) hours as needed for pain.   80 tablet   1   . INSULIN DETEMIR 100 UNIT/ML [place] SOLN   Subcutaneous   Inject 14-18 Units into the skin 2 (two) times daily. Per sliding scale         . INSULIN REGULAR HUMAN 100 UNIT/ML IJ SOLN   Subcutaneous   Inject 30-38 Units into the skin 2 (two) times daily before a meal. 30 units in the morning and 38 units in the evening         . LEVOTHYROXINE SODIUM 150 MCG PO TABS   Oral   Take 150 mcg by mouth daily. 1.5 tablets on Sundays, 1 tablet all other days         . LISINOPRIL 20 MG PO TABS   Oral   Take 20 mg by mouth daily.         Marland Kitchen LORATADINE 10 MG PO TABS   Oral   Take 10 mg by mouth daily as needed. For allergies         . METFORMIN HCL 1000 MG PO TABS    Oral   Take 1,000 mg by mouth daily with breakfast.         . METOPROLOL SUCCINATE ER 100 MG PO TB24   Oral   Take 100 mg by mouth daily. Take with or immediately following a meal.         . MONTELUKAST SODIUM 10 MG PO TABS   Oral   Take 10 mg by mouth every morning.         Marland Kitchen OMEPRAZOLE 20 MG PO CPDR   Oral   Take 20 mg by mouth daily.         Marland Kitchen VITAMIN D (ERGOCALCIFEROL) 50000 UNITS PO CAPS   Oral   Take 50,000 Units by mouth every 7 (seven) days. On Sundays           BP 144/69  Pulse 90  Temp 97.7 F (36.5 C) (Oral)  Resp 22  SpO2 97%  Physical Exam  Nursing note and vitals reviewed. Constitutional: She appears well-developed and well-nourished.       Uncomfortable appearing  HENT:  Head: Normocephalic and atraumatic.  Mouth/Throat: No oropharyngeal exudate.       Dry mucous membranes  Eyes: Conjunctivae normal and EOM are normal. Pupils are equal, round, and reactive to light. Right eye exhibits no discharge. Left eye exhibits no discharge. No scleral icterus.  Neck: No JVD present. No thyromegaly present.       Tender to palpation along the left strap muscles and paracervical muscles and trapezius on the left  Cardiovascular: Normal rate, regular rhythm, normal heart sounds and intact distal pulses.  Exam reveals no gallop and no friction rub.   No murmur heard. Pulmonary/Chest: Effort normal and breath sounds normal. No respiratory distress. She has no wheezes. She has no rales.  Abdominal: Soft. Bowel sounds are normal. She exhibits no distension and no mass. There is no tenderness.  Musculoskeletal: Normal range  of motion. She exhibits no edema and no tenderness.  Lymphadenopathy:    She has no cervical adenopathy.  Neurological: She is alert. Coordination normal.       Normal strength and sensation of all 4 extremities, normal coordination  Skin: Skin is warm and dry. No rash noted. No erythema.       Wound is clean dry and intact without  surrounding cellulitis  Psychiatric: She has a normal mood and affect. Her behavior is normal.    ED Course  Procedures (including critical care time)  Labs Reviewed  CBC WITH DIFFERENTIAL - Abnormal; Notable for the following:    RBC 3.85 (*)     Hemoglobin 11.4 (*)     All other components within normal limits  BASIC METABOLIC PANEL - Abnormal; Notable for the following:    Sodium 131 (*)     Chloride 95 (*)     Glucose, Bld 296 (*)     GFR calc non Af Amer 65 (*)     GFR calc Af Amer 75 (*)     All other components within normal limits   No results found.   1. Back pain       MDM  MRI interpretation reviewed, there is enhancement of 2 vertebrae along with soft tissue enhancement that is concerning for infection. Labs pending, pain medications ordered, muscle relaxants ordered, consultation neurosurgery.  MRI was reviewed, there is evidence of early infection in the bones and soft tissues surrounding the surgical site. There is no evidence of infection on the patient's exam over the soft tissues of the skin. She does not have a leukocytosis but because of her severe or worsening pain she will be admitted to the hospital. I have discussed her care with the neurosurgeon on call, Dr. Jeral Fruit who has agreed with holding orders and will see the patient in the morning. She appears clinically stable at this time.  Neurosurgery as recommended against IV antibiotics at this time, they do want to see the patient before starting any antibiotic therapy   Vida Roller, MD 10/13/12 1610  Vida Roller, MD 10/13/12 [phone]

## 2012-10-13 NOTE — Progress Notes (Signed)
Utilization review complete 

## 2012-10-13 NOTE — ED Notes (Signed)
Report given to [email or web address].  Pt transported to floor via stretcher.

## 2012-10-14 NOTE — Progress Notes (Signed)
Utilization review complete 

## 2012-10-14 NOTE — Progress Notes (Signed)
Still with marked thoracic pain and some anterior chest wall pain. No radiating numbness paresthesias or weakness. Pain somewhat better controlled after PCA.  Patient remains afebrile. Her vitals are stable. Wound is well-healed. Motor and sensory function extremities is normal.  Patient CT scan of her lumbar spine. This demonstrated evidence of an inferior endplate fracture of T6. She has a pedicle fracture of T7 and a spinous process fracture of T7 she has some kyphotic angulation at T6-7. There is nothing to suggest infection placed on the scans.  Multiple thoracic fractures secondary to lifting injury. Pain control main problem at present. I do not think that any type of percutaneous vertebroplasty would be of benefit. I would certainly like to avoid multilevel thoracic fusion surgery if possible. We will continue efforts at pain control and slowly mobilize the brace. If pain remains intractable then we may have to consider thoracic fusion but I am hopeful that her pain will settle down over the next few days.

## 2012-10-15 DIAGNOSIS — S22009A Unspecified fracture of unspecified thoracic vertebra, initial encounter for closed fracture: Secondary | ICD-10-CM | POA: Diagnosis present

## 2012-10-15 LAB — GLUCOSE, CAPILLARY
Glucose-Capillary: 133 mg/dL — ABNORMAL HIGH (ref 70–99)
Glucose-Capillary: 226 mg/dL — ABNORMAL HIGH (ref 70–99)
Glucose-Capillary: 139 mg/dL — ABNORMAL HIGH (ref 70–99)

## 2012-10-15 MED ORDER — BISACODYL 10 MG RE SUPP: 10.0000 mg | Freq: Every day | RECTAL | Status: DC | PRN

## 2012-10-15 MED ORDER — BISACODYL 5 MG PO TBEC: 5.0000 mg | DELAYED_RELEASE_TABLET | Freq: Every day | ORAL | Status: DC | PRN

## 2012-10-15 MED ORDER — FENTANYL 25 MCG/HR TD PT72
50.0000 ug | MEDICATED_PATCH | TRANSDERMAL | Status: DC
  Administered 2012-10-15 – 2012-10-17 (×2): 50 ug via TRANSDERMAL
  Filled 2012-10-15 (×2): qty 2

## 2012-10-15 MED ORDER — POLYETHYLENE GLYCOL 3350 17 G PO PACK
17.0000 g | PACK | Freq: Every day | ORAL | Status: DC
  Administered 2012-10-15 – 2012-10-17 (×3): 17 g via ORAL
  Filled 2012-10-15 (×3): qty 1

## 2012-10-15 NOTE — Progress Notes (Signed)
Pain control remains difficult. Pain centered in the mid thoracic region. No evidence of myelopathy.  She is afebrile. Her vitals are stable. She's oxygenating reasonably well. Urine output is good. On exam she is awake and alert. Oriented and appropriate. Motor 5/5 bilaterally. Chest clear to auscultation.  Lindsey West presents a difficult situation. She has significant fracture tumors T6 vertebra as well as her T7 vertebra. I do not think that percutaneous vertebroplasty hold any hope in alleviating her symptoms. I also would like to avoid multilevel fusion surgery if possible. I believe that her fractures are stable in her brace and should mend with nonoperative management. We will begin efforts at pain control with a Duragesic patch. Continue PCA for now for breakthrough pain. We'll try to mobilize with therapy. Patient may require skilled nursing facility placement for long-term convalescence.

## 2012-10-15 NOTE — Progress Notes (Addendum)
Pt. Woke up confused...pulling at pulse ox probe and stating "i am in a washer."  Confused as to year and place.  Pt. Alert to self and situation.  Confusion subsided after 5 minutes and patient A&Ox4 and returned to baseline.  Pt. States "I sometimes get confused when I first wake up and it takes me a few minutes to wake up."  Pt. Denies any symptoms and states "I feel fine."  VSS BP 142/82 O2 Sat 95% on 3L RR 16 HR 68 Temp 98.1. Will continue to monitor patient.

## 2012-10-15 NOTE — Progress Notes (Signed)
Rt Note: Spoke with pt about cpap she states she does have one at home that she sometimes wears but is refusing to wear one of our units tonight.

## 2012-10-16 LAB — GLUCOSE, CAPILLARY
Glucose-Capillary: 156 mg/dL — ABNORMAL HIGH (ref 70–99)
Glucose-Capillary: 191 mg/dL — ABNORMAL HIGH (ref 70–99)

## 2012-10-16 MED ORDER — HYDROMORPHONE HCL 2 MG PO TABS
2.0000 mg | ORAL_TABLET | ORAL | Status: DC | PRN
  Administered 2012-10-16 – 2012-10-17 (×7): 2 mg via ORAL
  Filled 2012-10-16 (×7): qty 1

## 2012-10-16 NOTE — Progress Notes (Signed)
Patient woke up very confused multiple times during the shift but gets oriented and returned to her baseline in 5 minutes each time.

## 2012-10-16 NOTE — Progress Notes (Signed)
Pt has machine in room but says she has not used it since shes been here.  Did not want me to put it on her.  i explained what it was for and she says shes had one for 6 years and has never used it.

## 2012-10-16 NOTE — Evaluation (Signed)
Physical Therapy Evaluation Patient Details Name: Lindsey West MRN: [id number] DOB: 10-18-1941 Today's Date: 10/16/2012 Time: 1300-1340 PT Time Calculation (min): 40 min  PT Assessment / Plan / Recommendation Clinical Impression  71 yo adm with T6 endplate fx, T7 Pedicle fx and T7 spinal process fx. Pt mobilizing well, however her abilty to don brace in sitting will ultimately drive her discharge plan (?if husband could don pt's brace for;her)    PT Assessment  Patient needs continued PT services    Follow Up Recommendations  Home health PT;Supervision for mobility/OOB         Barriers to Discharge Decreased caregiver support husband with health isssues per pt    Equipment Recommendations  None recommended by PT    Recommendations for Other Services     Frequency Min 5X/week    Precautions / Restrictions Precautions Precautions: Back Precaution Booklet Issued: Yes (comment) Required Braces or Orthoses: Spinal Brace Spinal Brace: Thoracolumbosacral orthotic;Applied in supine position (await MD clarification if can don in sitting) Restrictions Weight Bearing Restrictions: No   Pertinent Vitals/Pain 6/10 with activity      Mobility  Bed Mobility Bed Mobility: Rolling Right;Rolling Left;Right Sidelying to Sit;Sitting - Scoot to Edge of Bed Rolling Right: 5: Supervision;With rail Rolling Left: 5: Supervision;With rail Right Sidelying to Sit: 4: Min assist;With rails;HOB flat Sitting - Scoot to Edge of Bed: 4: Min guard Details for Bed Mobility Assistance: vc for technique to maintain back precautions Transfers Transfers: Sit to Stand;Stand to Sit Sit to Stand: 4: Min assist;From bed Stand to Sit: 4: Min assist;With upper extremity assist Details for Transfer Assistance: Pt likes to push down on handles of RW as comes to standing from bed. Ambulation/Gait Ambulation/Gait Assistance: 4: Min guard Ambulation Distance (Feet): 100 Feet Assistive device: Rolling  walker Ambulation/Gait Assistance Details: Pt reported she has walked to bathroom with nursing.  Gait Pattern: Step-through pattern;Decreased stride length Stairs: No              PT Diagnosis: Difficulty walking;Acute pain  PT Problem List: Decreased mobility;Decreased knowledge of use of DME;Decreased knowledge of precautions;Pain PT Treatment Interventions: DME instruction;Gait training;Functional mobility training;Therapeutic activities;Balance training;Patient/family education   PT Goals Acute Rehab PT Goals PT Goal Formulation: With patient Time For Goal Achievement: 10/23/12 Potential to Achieve Goals: Good Pt will Roll Supine to Right Side: with supervision;with cues (comment type and amount) PT Goal: Rolling Supine to Right Side - Progress: Goal set today Pt will Roll Supine to Left Side: with supervision;with cues (comment type and amount) PT Goal: Rolling Supine to Left Side - Progress: Goal set today Pt will go Supine/Side to Sit: with supervision;with HOB 0 degrees PT Goal: Supine/Side to Sit - Progress: Goal set today Pt will go Sit to Supine/Side: with supervision;with HOB 0 degrees PT Goal: Sit to Supine/Side - Progress: Goal set today Pt will go Sit to Stand: with supervision;with upper extremity assist PT Goal: Sit to Stand - Progress: Goal set today Pt will go Stand to Sit: with supervision;with upper extremity assist PT Goal: Stand to Sit - Progress: Goal set today Pt will Ambulate: 51 - 150 feet;with supervision;with least restrictive assistive device PT Goal: Ambulate - Progress: Goal set today Additional Goals Additional Goal #1: pt will verbalize and adhere to back precautions during mobility PT Goal: Additional Goal #1 - Progress: Goal set today  Visit Information  Last PT Received On: 10/16/12 Assistance Needed: +2 (don brace in supine) PT/OT Co-Evaluation/Treatment: Yes    Subjective  Data  Subjective: Pt asking if she'll need to get rails for her  bed at home Patient Stated Goal: return home with husband if he can help her (he has health issues)   Prior Functioning  Home Living Lives With: Spouse Available Help at Discharge: Family;Available 24 hours/day Type of Home: House Home Access: Level entry Home Layout: One level Bathroom Shower/Tub: Health visitor: Handicapped height Bathroom Accessibility: Yes How Accessible: Accessible via walker (very tight) Home Adaptive Equipment: Bedside commode/3-in-1;Shower chair with back;Walker - rolling;Wheelchair - powered Additional Comments: uses power wheelchair when she goes into community Prior Function Level of Independence: Needs assistance Needs Assistance: Dressing Driving: No Communication Communication: No difficulties    Cognition  Overall Cognitive Status: Appears within functional limits for tasks assessed/performed Arousal/Alertness: Awake/alert Orientation Level: Appears intact for tasks assessed Behavior During Session: [facility] for tasks performed    Extremity/Trunk Assessment Right Lower Extremity Assessment RLE ROM/Strength/Tone: [facility] for tasks assessed RLE Sensation: History of peripheral neuropathy RLE Coordination: WFL - gross motor Left Lower Extremity Assessment LLE ROM/Strength/Tone: WFL for tasks assessed LLE Sensation: History of peripheral neuropathy LLE Coordination: WFL - gross motor Trunk Assessment Trunk Assessment: Normal   Balance    End of Session PT - End of Session Equipment Utilized During Treatment: Gait belt;Back brace Activity Tolerance: Patient tolerated treatment well Patient left: in chair;with call bell/phone within reach;with chair alarm set;with family/visitor present Nurse Communication: Mobility status  GP     Xin Klawitter 10/16/2012, 3:01 PM  Pager [phone]

## 2012-10-16 NOTE — Progress Notes (Signed)
12/19  Patient takes Levemir 14-18 units twice a day at home.  Consider starting at least half of home dose of Levemir while in hospital.  Continue Novolog MODERATE correction scale TID and HS.  Will continue to follow while in hospital.  Smith Mince RN BSN CDE

## 2012-10-16 NOTE — Progress Notes (Signed)
No new problems overnight. Patient's pain control seems better. No new complaints. She states she wants to work towards getting home.  She is afebrile. Vitals are stable. Neurologically she is awake and alert. She is currently oriented and appropriate. Motor and sensory function intact.  I will stop her PCA today. Give her oxycodone for breakthrough pain. Continue with Duragesic patch. Will mobilize more today. If patient tolerates changes recently well then we will work towards home discharge with home therapy tomorrow.

## 2012-10-16 NOTE — Evaluation (Signed)
Occupational Therapy Evaluation Patient Details Name: Lindsey West MRN: [id number] DOB: 01/07/1941 Today's Date: 10/16/2012 Time: 8469-6295 OT Time Calculation (min): 40 min  OT Assessment / Plan / Recommendation Clinical Impression  Pt admitted with T6 endplate fx, T7 Pedicle fx and T7 spinal process fx. Pt was dependent in LB ADL and showering prior to admission.  Husband will need to be able to don back brace for her at home. Will follow to provide family instruction and address ADL to decrease burden of care upon return home.      OT Assessment  Patient needs continued OT Services    Follow Up Recommendations  Home health OT    Barriers to Discharge      Equipment Recommendations       Recommendations for Other Services    Frequency  Min 2X/week    Precautions / Restrictions Precautions Precautions: Back Precaution Booklet Issued: Yes (comment) Precaution Comments: educated in back precautions Required Braces or Orthoses: Spinal Brace Spinal Brace: Thoracolumbosacral orthotic;Applied in supine position Restrictions Weight Bearing Restrictions: No   Pertinent Vitals/Pain Back, premedicated, repositioned    ADL  Eating/Feeding: Independent Where Assessed - Eating/Feeding: Bed level Grooming: Set up Where Assessed - Grooming: Unsupported sitting Upper Body Bathing: Maximal assistance Where Assessed - Upper Body Bathing: Supine, head of bed up Lower Body Bathing: +1 Total assistance Where Assessed - Lower Body Bathing: Supported sit to stand;Unsupported sitting Upper Body Dressing: Minimal assistance Where Assessed - Upper Body Dressing: Unsupported sitting Lower Body Dressing: +1 Total assistance Where Assessed - Lower Body Dressing: Unsupported sitting;Supported sit to stand Toilet Transfer: Min Pension scheme manager Method: Surveyor, minerals: Bedside commode Toileting - Clothing Manipulation and Hygiene: +1 Total assistance Where Assessed  - Engineer, mining and Hygiene: Standing Equipment Used: Back brace;Gait belt;Rolling walker Transfers/Ambulation Related to ADLs: ambulated with min guard assist with chair following for safety. ADL Comments: Pt was assisted for LB ADL and showering prior to admission.  Will rely on husband as prior to admission.    OT Diagnosis: Generalized weakness;Acute pain  OT Problem List: Decreased strength;Decreased activity tolerance;Impaired balance (sitting and/or standing);Decreased knowledge of precautions;Obesity;Pain OT Treatment Interventions: Self-care/ADL training;DME and/or AE instruction;Patient/family education;Therapeutic activities   OT Goals Acute Rehab OT Goals OT Goal Formulation: With patient Time For Goal Achievement: 10/23/12 Potential to Achieve Goals: Good ADL Goals Pt Will Perform Grooming: with supervision;Standing at sink;Other (comment) (2 activities) ADL Goal: Grooming - Progress: Goal set today Pt Will Perform Upper Body Dressing: Other (comment);with caregiver independent in assisting;with modified independence (back brace, in position ordered by MD) ADL Goal: Upper Body Dressing - Progress: Goal set today Pt Will Transfer to Toilet: with supervision;3-in-1;Maintaining back safety precautions;with caregiver independent in assisting;Ambulation ADL Goal: Toilet Transfer - Progress: Goal set today Pt Will Perform Toileting - Clothing Manipulation: with supervision;Standing ADL Goal: Toileting - Clothing Manipulation - Progress: Goal set today Pt Will Perform Toileting - Hygiene: with supervision;Sit to stand from 3-in-1/toilet;with adaptive equipment ADL Goal: Toileting - Hygiene - Progress: Goal set today Pt Will Perform Tub/Shower Transfer: Shower transfer;with supervision;Ambulation;Maintaining back safety precautions ADL Goal: Tub/Shower Transfer - Progress: Goal set today Miscellaneous OT Goals Miscellaneous OT Goal #1: Pt will state and adhere to  back precautions during mobility and ADL. OT Goal: Miscellaneous Goal #1 - Progress: Goal set today  Visit Information  Last OT Received On: 10/16/12 Assistance Needed: +2 (donn brace in supine) PT/OT Co-Evaluation/Treatment: Yes    Subjective Data  Subjective: "  My husband washes me when I stand in the shower." Patient Stated Goal: Return to PLOF.   Prior Functioning     Home Living Lives With: Spouse Available Help at Discharge: Family;Available 24 hours/day Type of Home: House Home Access: Level entry Home Layout: One level Bathroom Shower/Tub: Health visitor: Handicapped height Bathroom Accessibility: Yes How Accessible: Accessible via walker (very tight) Home Adaptive Equipment: Bedside commode/3-in-1;Shower chair with back;Walker - rolling;Wheelchair - powered Additional Comments: uses power wheelchair when she goes into community Prior Function Level of Independence: Needs assistance Needs Assistance: Dressing Driving: No Communication Communication: No difficulties Dominant Hand: Right         Vision/Perception     Cognition  Overall Cognitive Status: Appears within functional limits for tasks assessed/performed Arousal/Alertness: Awake/alert Orientation Level: Appears intact for tasks assessed Behavior During Session: [facility] for tasks performed    Extremity/Trunk Assessment Right Upper Extremity Assessment RUE ROM/Strength/Tone: [facility] for tasks assessed RUE Sensation: History of peripheral neuropathy Left Upper Extremity Assessment LUE ROM/Strength/Tone: WFL for tasks assessed LUE Sensation: History of peripheral neuropathy Right Lower Extremity Assessment RLE ROM/Strength/Tone: WFL for tasks assessed RLE Sensation: History of peripheral neuropathy RLE Coordination: WFL - gross motor Left Lower Extremity Assessment LLE ROM/Strength/Tone: WFL for tasks assessed LLE Sensation: History of peripheral neuropathy LLE Coordination: WFL - gross  motor Trunk Assessment Trunk Assessment: Normal     Mobility Bed Mobility Bed Mobility: Rolling Right;Rolling Left;Right Sidelying to Sit;Sitting - Scoot to Edge of Bed Rolling Right: 5: Supervision;With rail Rolling Left: 5: Supervision;With rail Right Sidelying to Sit: 4: Min assist;With rails;HOB flat Sitting - Scoot to Edge of Bed: 4: Min guard Details for Bed Mobility Assistance: vc for technique to maintain back precautions Transfers Sit to Stand: 4: Min assist;From bed Stand to Sit: 4: Min assist;With upper extremity assist Details for Transfer Assistance: Pt likes to push down on handles of RW as comes to standing from bed.     Shoulder Instructions     Exercise     Balance     End of Session OT - End of Session Activity Tolerance: Patient limited by fatigue Patient left: in chair;with call bell/phone within reach Nurse Communication: Mobility status  GO     Evern Bio 10/16/2012, 3:46 PM [phone]

## 2012-10-17 LAB — GLUCOSE, CAPILLARY: Glucose-Capillary: 186 mg/dL — ABNORMAL HIGH (ref 70–99)

## 2012-10-17 MED ORDER — FENTANYL 25 MCG/HR TD PT72: 2.0000 | MEDICATED_PATCH | TRANSDERMAL | Status: DC

## 2012-10-17 MED ORDER — CYCLOBENZAPRINE HCL 10 MG PO TABS: 10.0000 mg | ORAL_TABLET | Freq: Three times a day (TID) | ORAL | Status: DC | PRN

## 2012-10-17 MED ORDER — HYDROMORPHONE HCL 2 MG PO TABS
2.0000 mg | ORAL_TABLET | ORAL | Status: DC | PRN
Start: 2012-10-17 — End: 2024-07-21

## 2012-10-17 NOTE — Progress Notes (Signed)
Occupational Therapy Treatment Patient Details Name: Lindsey West MRN: [id number] DOB: 1941/07/27 Today's Date: 10/17/2012 Time: 4098-1191 OT Time Calculation (min): 33 min  OT Assessment / Plan / Recommendation Comments on Treatment Session Pt with improved activity tolerance.  Awaiting clarification of position for donning brace before teaching family. Required sitting rest break after several minutes of standing, but ambulated 250 ft afterward.  Needs to practice toilet transfer and pericare and shower transfer.    Follow Up Recommendations  Home health OT    Barriers to Discharge       Equipment Recommendations       Recommendations for Other Services    Frequency     Plan Discharge plan remains appropriate    Precautions / Restrictions Precautions Precautions: Back Precaution Comments: educated in back precautions Required Braces or Orthoses: Spinal Brace Spinal Brace: Thoracolumbosacral orthotic;Applied in supine position (awaiting clarification)   Pertinent Vitals/Pain R side, did not rate, RN aware    ADL  Grooming: Supervision/safety;Teeth care Where Assessed - Grooming: Unsupported standing Upper Body Dressing: Other (comment);Minimal assistance (total assist to tighten TLSO) Where Assessed - Upper Body Dressing: Unsupported sitting Equipment Used: Back brace;Gait belt;Rolling walker Transfers/Ambulation Related to ADLs: min guard assist with RW and verbal cues for technique and hand position ADL Comments: Pt unable to lean forward in TLSO due to girth.  Instructed in spitting in basin in standing.    OT Diagnosis:    OT Problem List:   OT Treatment Interventions:     OT Goals ADL Goals Pt Will Perform Grooming: with supervision;Standing at sink;Other (comment) ADL Goal: Grooming - Progress: Progressing toward goals Pt Will Perform Upper Body Dressing: Other (comment);with caregiver independent in assisting;with modified independence Pt Will Transfer to  Toilet: with supervision;3-in-1;Maintaining back safety precautions;with caregiver independent in assisting;Ambulation Pt Will Perform Toileting - Clothing Manipulation: with supervision;Standing Pt Will Perform Toileting - Hygiene: with supervision;Sit to stand from 3-in-1/toilet;with adaptive equipment Pt Will Perform Tub/Shower Transfer: Shower transfer;with supervision;Ambulation;Maintaining back safety precautions Miscellaneous OT Goals Miscellaneous OT Goal #1: Pt will state and adhere to back precautions during mobility and ADL. OT Goal: Miscellaneous Goal #1 - Progress: Progressing toward goals  Visit Information  Last OT Received On: 10/17/12 Assistance Needed: +2 (for safety with ambulation) PT/OT Co-Evaluation/Treatment: Yes    Subjective Data      Prior Functioning       Cognition  Overall Cognitive Status: Appears within functional limits for tasks assessed/performed Arousal/Alertness: Awake/alert Orientation Level: Appears intact for tasks assessed Behavior During Session: [facility] for tasks performed Cognition - Other Comments: pt reports losing her train of thought when talking    Mobility  Shoulder Instructions Bed Mobility Bed Mobility: Sit to Sidelying Right;Rolling Right;Rolling Left Rolling Right: 5: Supervision;With rail Rolling Left: 5: Supervision;With rail Sit to Sidelying Right: 5: Supervision;HOB flat Details for Bed Mobility Assistance: vc for technique to maintain back precautions Transfers, pt advised in alternatives to bed rails to assist with side to sit at home. Sit to Stand: 4: Min guard;With upper extremity assist;From chair/3-in-1 Stand to Sit: 4: Min guard;To chair/3-in-1;To bed Details for Transfer Assistance: verbal cues for hand placement       Exercises      Balance     End of Session OT - End of Session Activity Tolerance: Patient tolerated treatment well Patient left: in bed;with call bell/phone within reach Nurse Communication:  Mobility status  GO     Evern Bio 10/17/2012, 2:56 PM [phone]

## 2012-10-17 NOTE — Care Management Note (Signed)
    Page 1 of 2   10/20/2012     8:25:07 AM   CARE MANAGEMENT NOTE 10/20/2012  Patient:  Lindsey West, Lindsey West   Account Number:  [id number]  Date Initiated:  10/13/2012  Documentation initiated by:  [facility]  Subjective/Objective Assessment:   Thoracic Discectomy     Action/Plan:   lives at home with husband  PT/OT evals-PT and OT recommending HHPT and HHOT   Anticipated DC Date:  10/18/2012   Anticipated DC Plan:  HOME W HOME HEALTH SERVICES      DC Planning Services  CM consult      Choice offered to / List presented to:  C-1 Patient        HH arranged  HH-2 PT  HH-3 OT      Status of service:  Completed, signed off Medicare Important Message given?   (If response is "NO", the following Medicare IM given date fields will be blank) Date Medicare IM given:   Date Additional Medicare IM given:    Discharge Disposition:  HOME W HOME HEALTH SERVICES  Per UR Regulation:  Reviewed for med. necessity/level of care/duration of stay  If discussed at Long Length of Stay Meetings, dates discussed:    Comments:  10/17/12 PT and OT recommending HHPT and HHOT. Spoke with patient about HHC in case MD orders HHC at discharge. Patient chose [facility] from the list of [facility] agencies.Contacted Hennesy Sobalvarro at [facility] and requested HHPT and HHOT, orders pending. [facility] will check when patient discharged for [facility] orders. No equipment needs identified. Jacquelynn Cree RN, BSN, CCM   10/13/2012 1530 NCM spoke to pt and she states she lives at home with husband. She did not require any HH after her last surgery. She is waiting for recommendations from her surgeon. Pt complaining of pain 9/10 on pain scale. States she has not made her RN aware. NCM contacted Unit RN to make aware of pt's complaints of severe pain.  Isidoro Donning RN CCM Case Mgmt phone [phone]

## 2012-10-17 NOTE — Progress Notes (Signed)
[facility]  Patient Status: New  [facility] is providing the following services: PT and OT  If patient discharges after hours, please call [phone].   Lindsey West 10/17/2012, 4:42 PM

## 2012-10-17 NOTE — Progress Notes (Signed)
Physical Therapy Treatment Patient Details Name: Lindsey West MRN: [id number] DOB: 1941-05-17 Today's Date: 10/17/2012 Time: 8657-8469 PT Time Calculation (min): 34 min  PT Assessment / Plan / Recommendation Comments on Treatment Session  71 yo adm with T6 endplate fx, T7 Pedicle fx and T7 spinal process fx. Pt progressing well. Pt remains very concerned that she will need bed rail to push up on at home. Every option offered to her, she had a reason it would not work (hospital bed, under mattress rail, dining room chair turned with back to bed and anchored with heavy objects in seat). Husband has not been educated on donning brace due to unclear if she will be allowed by MD to don in sitting. Further PT needed prior to d/c.     Follow Up Recommendations  Home health PT;Supervision for mobility/OOB                 Equipment Recommendations  None recommended by PT    Recommendations for Other Services    Frequency Min 5X/week   Plan Discharge plan remains appropriate;Frequency remains appropriate    Precautions / Restrictions Precautions Precautions: Back Precaution Comments: educated in back precautions Required Braces or Orthoses: Spinal Brace Spinal Brace: Thoracolumbosacral orthotic;Applied in supine position (awaiting MD clarification if can sit to don)   Pertinent Vitals/Pain 6/10 lower Rt side and low back; brace repositioned/tightened and pt then repositioned in bed at end of session    Mobility  Bed Mobility Bed Mobility: Rolling Right;Rolling Left;Sit to Sidelying Right Rolling Right: 5: Supervision;With rail Rolling Left: 5: Supervision;With rail Sit to Sidelying Right: 4: Min assist;HOB flat Details for Bed Mobility Assistance: vc for technique to maintain back precautions; vc for sequencing Transfers Transfers: Sit to Stand;Stand to Sit Sit to Stand: 4: Min assist;With upper extremity assist;With armrests;From chair/3-in-1 Stand to Sit: 4: Min guard;With upper  extremity assist;To bed;To chair/3-in-1;With armrests Details for Transfer Assistance: x 2 (seated rest after brushing teeth); vc for safe use of RW Ambulation/Gait Ambulation/Gait Assistance: 4: Min guard Ambulation Distance (Feet): 250 Feet (15 ft, rest, 235 ft) Assistive device: Rolling walker Ambulation/Gait Assistance Details: Pt more upright and better use of RW; vc for safe distance to RW Gait Pattern: Step-through pattern;Decreased stride length    Exercises     PT Diagnosis:    PT Problem List:   PT Treatment Interventions:     PT Goals Acute Rehab PT Goals Pt will Roll Supine to Right Side: with supervision;with cues (comment type and amount) PT Goal: Rolling Supine to Right Side - Progress: Partly met Pt will Roll Supine to Left Side: with supervision;with cues (comment type and amount) PT Goal: Rolling Supine to Left Side - Progress: Partly met Pt will go Sit to Supine/Side: with supervision;with HOB 0 degrees PT Goal: Sit to Supine/Side - Progress: Progressing toward goal Pt will go Sit to Stand: with supervision;with upper extremity assist PT Goal: Sit to Stand - Progress: Progressing toward goal Pt will go Stand to Sit: with supervision;with upper extremity assist PT Goal: Stand to Sit - Progress: Progressing toward goal Pt will Ambulate: 51 - 150 feet;with supervision;with least restrictive assistive device PT Goal: Ambulate - Progress: Progressing toward goal Additional Goals Additional Goal #1: pt will verbalize and adhere to back precautions during mobility PT Goal: Additional Goal #1 - Progress: Progressing toward goal  Visit Information  Last PT Received On: 10/17/12 Assistance Needed: +1 (don brace supine)    Subjective Data  Subjective: Reports she  cannot afford the $300 rails she found on internet; states does not have room for a hospital bed Patient Stated Goal: return home with husband if he can help her (he has health issues)   Cognition  Overall  Cognitive Status: Appears within functional limits for tasks assessed/performed Arousal/Alertness: Awake/alert Orientation Level: Appears intact for tasks assessed Behavior During Session: [facility] for tasks performed Cognition - Other Comments: pt reports losing her train of thought when talking    Balance     End of Session PT - End of Session Equipment Utilized During Treatment: Gait belt;Back brace Activity Tolerance: Patient tolerated treatment well Patient left: in bed;with call bell/phone within reach   GP     Hollyann Pablo 10/17/2012, 3:02 PM Pager [phone]

## 2012-10-17 NOTE — Progress Notes (Signed)
Pt education complete. Pt ready for discharge. Pt stable and awaiting ride from husband.

## 2012-10-17 NOTE — Discharge Summary (Signed)
Physician Discharge Summary  Patient ID: Lindsey West MRN: [id number] DOB/AGE: 1941-08-11 71 y.o.  Admit date: 10/13/2012 Discharge date: 10/17/2012  Admission Diagnoses:  Discharge Diagnoses:  Principal Problem:  *Fracture, thoracic vertebra   Discharged Condition: fair  Hospital Course: Patient in the hospital for pain control relative to a T6 fracture and T7 fracture. Workup was obtained which is essentially ruled out infection. Patient has gradually experience better pain control and is mobilized in physical and occupational therapy. She continues to use her brace. Plan is for discharge home with home therapies.  Consults:   Significant Diagnostic Studies:   Treatments:   Discharge Exam: Blood pressure 125/42, pulse 65, temperature 98.1 F (36.7 C), temperature source Oral, resp. rate 18, height 5\' 6"  (1.676 m), weight 107.956 kg (238 lb), SpO2 95.00%. Awake and alert oriented and appropriate. Cranial nerve function is intact. Motor and sensory function of the extremities is normal. Wound clean and dry. Chest and abdomen benign. Ambulating with minimal assistance. Disposition: 01-Home or Self Care  Discharge Orders    Future Orders Please Complete By Expires   Home Health      Questions: Responses:   To provide the following care/treatments PT    OT   Face-to-face encounter      Comments:   I Jeweliana Dudgeon A certify that this patient is under my care and that I, or a nurse practitioner or physician's assistant working with me, had a face-to-face encounter that meets the physician face-to-face encounter requirements with this patient on 10/17/2012. The encounter with the patient was in whole, or in part for the following medical condition(s) which is the primary reason for home health care (List medical condition): Thoracic fracture   Questions: Responses:   The encounter with the patient was in whole, or in part, for the following medical condition, which is the primary  reason for home health care Thoracic fracture   I certify that, based on my findings, the following services are medically necessary home health services Physical therapy   My clinical findings support the need for the above services Pain interferes with ambulation/mobility   Further, I certify that my clinical findings support that this patient is homebound due to: Ambulates short distances less than 300 feet   To provide the following care/treatments PT    OT       Medication List     As of 10/17/2012  5:25 PM    TAKE these medications         amitriptyline 100 MG tablet   Commonly known as: ELAVIL   Take 100 mg by mouth at bedtime.      aspirin 81 MG tablet   Take 81 mg by mouth daily.      citalopram 20 MG tablet   Commonly known as: CELEXA   Take 20 mg by mouth daily.      cyclobenzaprine 10 MG tablet   Commonly known as: FLEXERIL   Take 1 tablet (10 mg total) by mouth 3 (three) times daily as needed for muscle spasms.      cyclobenzaprine 10 MG tablet   Commonly known as: FLEXERIL   Take 1 tablet (10 mg total) by mouth 3 (three) times daily as needed for muscle spasms.      fentaNYL 25 MCG/HR   Commonly known as: DURAGESIC - dosed mcg/hr   Place 2 patches (50 mcg total) onto the skin every other day.      fish oil-omega-3 fatty acids 1000 MG capsule  Take 2 g by mouth 2 (two) times daily.      Fluticasone-Salmeterol 250-50 MCG/DOSE Aepb   Commonly known as: ADVAIR   Inhale 2 puffs into the lungs daily.      HYDROcodone-acetaminophen 10-325 MG per tablet   Commonly known as: NORCO   Take 1-2 tablets by mouth every 4 (four) hours as needed for pain.      HYDROmorphone 2 MG tablet   Commonly known as: DILAUDID   Take 1 tablet (2 mg total) by mouth every 3 (three) hours as needed for pain.      insulin detemir 100 UNIT/ML injection   Commonly known as: LEVEMIR   Inject 14-18 Units into the skin 2 (two) times daily. Per sliding scale      insulin regular 100  units/mL injection   Commonly known as: NOVOLIN R,HUMULIN R   Inject 30-38 Units into the skin 2 (two) times daily before a meal. 30 units in the morning and 38 units in the evening      levothyroxine 150 MCG tablet   Commonly known as: SYNTHROID, LEVOTHROID   Take 150 mcg by mouth daily. 1.5 tablets on Sundays, 1 tablet all other days      lisinopril 20 MG tablet   Commonly known as: PRINIVIL,ZESTRIL   Take 20 mg by mouth daily.      loratadine 10 MG tablet   Commonly known as: CLARITIN   Take 10 mg by mouth daily as needed. For allergies      metFORMIN 1000 MG tablet   Commonly known as: GLUCOPHAGE   Take 1,000 mg by mouth daily with breakfast.      metoprolol succinate 100 MG 24 hr tablet   Commonly known as: TOPROL-XL   Take 100 mg by mouth daily. Take with or immediately following a meal.      montelukast 10 MG tablet   Commonly known as: SINGULAIR   Take 10 mg by mouth every morning.      omeprazole 20 MG capsule   Commonly known as: PRILOSEC   Take 20 mg by mouth daily.      Vitamin D (Ergocalciferol) 50000 UNITS Caps   Commonly known as: DRISDOL   Take 50,000 Units by mouth every 7 (seven) days. On Sundays           Follow-up Information    Follow up with Ozzie Remmers A, MD. Call in 2 weeks. (Ask for Lurena Joiner)    Contact information:   [address] [place] [address] [phone]          Signed: Temple Pacini 10/17/2012, 5:25 PM

## 2012-11-12 ENCOUNTER — Other Ambulatory Visit (HOSPITAL_COMMUNITY): Payer: Self-pay | Admitting: Neurosurgery

## 2012-12-23 ENCOUNTER — Other Ambulatory Visit (HOSPITAL_COMMUNITY): Payer: Self-pay | Admitting: Neurosurgery

## 2012-12-25 ENCOUNTER — Ambulatory Visit (HOSPITAL_COMMUNITY)
Admission: RE | Admit: 2012-12-25 | Discharge: 2012-12-25 | Disposition: A | Discharge status: Home / Self Care | Payer: Medicare Other | Source: Ambulatory Visit | Attending: Neurosurgery | Admitting: Neurosurgery

## 2012-12-25 DIAGNOSIS — M8448XA Pathological fracture, other site, initial encounter for fracture: Secondary | ICD-10-CM | POA: Insufficient documentation

## 2012-12-25 DIAGNOSIS — M546 Pain in thoracic spine: Secondary | ICD-10-CM | POA: Insufficient documentation

## 2013-03-03 ENCOUNTER — Other Ambulatory Visit: Payer: Self-pay | Admitting: Neurosurgery

## 2013-03-03 DIAGNOSIS — M549 Dorsalgia, unspecified: Secondary | ICD-10-CM

## 2013-03-09 ENCOUNTER — Ambulatory Visit
Admission: RE | Admit: 2013-03-09 | Discharge: 2013-03-09 | Disposition: A | Discharge status: Home / Self Care | Payer: Medicare Other | Source: Ambulatory Visit | Attending: Neurosurgery | Admitting: Neurosurgery

## 2013-03-09 VITALS — BP 181/77 | HR 80

## 2013-03-09 DIAGNOSIS — M5124 Other intervertebral disc displacement, thoracic region: Secondary | ICD-10-CM

## 2013-03-09 DIAGNOSIS — M549 Dorsalgia, unspecified: Secondary | ICD-10-CM

## 2013-03-09 DIAGNOSIS — S22009S Unspecified fracture of unspecified thoracic vertebra, sequela: Secondary | ICD-10-CM

## 2013-03-09 MED ORDER — ACETAMINOPHEN 500 MG PO TABS: 500.0000 mg | ORAL_TABLET | Freq: Four times a day (QID) | ORAL | Status: DC | PRN

## 2013-03-09 MED ORDER — IOHEXOL 300 MG/ML  SOLN
9.0000 mL | Freq: Once | INTRAMUSCULAR | Status: AC | PRN
  Administered 2013-03-09: 9 mL via INTRATHECAL

## 2013-03-09 NOTE — Progress Notes (Addendum)
Patient states she has been off Celexa and Amitriptyline for the past two days.  Discharge instructions explained to patient.  No Valium given since patient has sensitivity to it.  Donell Sievert, RN

## 2020-01-08 ENCOUNTER — Ambulatory Visit: Payer: Self-pay | Attending: Internal Medicine

## 2020-01-08 DIAGNOSIS — Z23 Encounter for immunization: Secondary | ICD-10-CM

## 2020-01-08 NOTE — Progress Notes (Signed)
   Covid-19 Vaccination Clinic  Name:  Lindsey West    MRN: [id number] DOB: November 03, 1940  01/08/2020  [name] was observed post Covid-19 immunization for 15 minutes without incident. She was provided with Vaccine Information Sheet and instruction to access the V-Safe system.   [name] was instructed to call 911 with any severe reactions post vaccine: Marland Kitchen Difficulty breathing  . Swelling of face and throat  . A fast heartbeat  . A bad rash all over body  . Dizziness and weakness   Immunizations Administered    Name Date Dose VIS Date Route   Moderna COVID-19 Vaccine 01/08/2020  3:03 AM 0.5 mL 09/29/2019 Intramuscular   Manufacturer: Moderna   Lot: [id number]   [id number]

## 2020-02-04 ENCOUNTER — Ambulatory Visit: Payer: Medicare HMO | Attending: Internal Medicine

## 2020-02-04 DIAGNOSIS — Z23 Encounter for immunization: Secondary | ICD-10-CM

## 2020-02-04 NOTE — Progress Notes (Signed)
   Covid-19 Vaccination Clinic  Name:  Lindsey West    MRN: [id number] DOB: 08-05-1941  02/04/2020  [name] was observed post Covid-19 immunization for 15 minutes without incident. She was provided with Vaccine Information Sheet and instruction to access the V-Safe system.   [name] was instructed to call 911 with any severe reactions post vaccine: Marland Kitchen Difficulty breathing  . Swelling of face and throat  . A fast heartbeat  . A bad rash all over body  . Dizziness and weakness   Immunizations Administered    Name Date Dose VIS Date Route   Moderna COVID-19 Vaccine 02/04/2020 10:22 AM 0.5 mL 09/29/2019 Intramuscular   Manufacturer: Moderna   Lot: [id number]   [id number]

## 2020-02-09 ENCOUNTER — Ambulatory Visit: Payer: Self-pay

## 2024-05-29 DEATH — deceased
